# Patient Record
Sex: Male | Born: 1975
Health system: Southern US, Community
[De-identification: ages and names within clinical notes are randomized; demographics above are authoritative.]

## PROBLEM LIST (undated history)

## (undated) DIAGNOSIS — R079 Chest pain, unspecified: Secondary | ICD-10-CM

## (undated) HISTORY — PX: WISDOM TOOTH EXTRACTION: SHX21

## (undated) HISTORY — DX: Chest pain, unspecified: R07.9

## (undated) HISTORY — PX: FINGER AMPUTATION: SHX636

## (undated) HISTORY — PX: FOOT SURGERY: SHX648

---

## 1998-02-26 ENCOUNTER — Emergency Department (HOSPITAL_COMMUNITY): Admission: EM | Admit: 1998-02-26 | Discharge: 1998-02-26 | Payer: Self-pay | Admitting: Emergency Medicine

## 1998-04-10 ENCOUNTER — Emergency Department (HOSPITAL_COMMUNITY): Admission: EM | Admit: 1998-04-10 | Discharge: 1998-04-11 | Payer: Self-pay | Admitting: Emergency Medicine

## 1998-04-12 ENCOUNTER — Encounter: Admission: RE | Admit: 1998-04-12 | Discharge: 1998-07-11 | Payer: Self-pay | Admitting: *Deleted

## 1998-07-02 ENCOUNTER — Emergency Department (HOSPITAL_COMMUNITY): Admission: EM | Admit: 1998-07-02 | Discharge: 1998-07-02 | Payer: Self-pay | Admitting: Emergency Medicine

## 1998-07-19 ENCOUNTER — Encounter: Admission: RE | Admit: 1998-07-19 | Discharge: 1998-07-19 | Payer: Self-pay | Admitting: *Deleted

## 1998-07-25 ENCOUNTER — Ambulatory Visit: Admission: RE | Admit: 1998-07-25 | Discharge: 1998-07-25 | Payer: Self-pay | Admitting: *Deleted

## 1998-07-25 ENCOUNTER — Encounter: Payer: Self-pay | Admitting: *Deleted

## 1998-08-08 ENCOUNTER — Emergency Department (HOSPITAL_COMMUNITY): Admission: EM | Admit: 1998-08-08 | Discharge: 1998-08-08 | Payer: Self-pay | Admitting: Emergency Medicine

## 2008-05-25 ENCOUNTER — Emergency Department (HOSPITAL_COMMUNITY): Admission: EM | Admit: 2008-05-25 | Discharge: 2008-05-25 | Payer: Self-pay | Admitting: Emergency Medicine

## 2008-06-01 ENCOUNTER — Emergency Department (HOSPITAL_COMMUNITY): Admission: EM | Admit: 2008-06-01 | Discharge: 2008-06-01 | Payer: Self-pay | Admitting: Specialist

## 2009-04-09 ENCOUNTER — Emergency Department (HOSPITAL_COMMUNITY): Admission: EM | Admit: 2009-04-09 | Discharge: 2009-04-09 | Payer: Self-pay | Admitting: Emergency Medicine

## 2010-08-03 ENCOUNTER — Emergency Department (HOSPITAL_COMMUNITY)
Admission: EM | Admit: 2010-08-03 | Discharge: 2010-08-03 | Discharge status: Against Medical Advice | Payer: BC Managed Care – PPO | Attending: Emergency Medicine | Admitting: Emergency Medicine

## 2010-08-03 DIAGNOSIS — R42 Dizziness and giddiness: Secondary | ICD-10-CM | POA: Insufficient documentation

## 2010-08-04 ENCOUNTER — Emergency Department (HOSPITAL_COMMUNITY)
Admission: EM | Admit: 2010-08-04 | Discharge: 2010-08-04 | Disposition: A | Discharge status: Home / Self Care | Payer: BC Managed Care – PPO | Attending: Emergency Medicine | Admitting: Emergency Medicine

## 2010-08-04 ENCOUNTER — Emergency Department (HOSPITAL_COMMUNITY): Payer: BC Managed Care – PPO

## 2010-08-04 DIAGNOSIS — F17200 Nicotine dependence, unspecified, uncomplicated: Secondary | ICD-10-CM | POA: Insufficient documentation

## 2010-08-04 DIAGNOSIS — R5381 Other malaise: Secondary | ICD-10-CM | POA: Insufficient documentation

## 2010-08-04 LAB — CBC
HCT: 43 % (ref 39.0–52.0)
Hemoglobin: 14.8 g/dL (ref 13.0–17.0)
MCH: 31 pg (ref 26.0–34.0)
MCHC: 34.4 g/dL (ref 30.0–36.0)
MCV: 90.1 fL (ref 78.0–100.0)
Platelets: 254 10*3/uL (ref 150–400)
RBC: 4.77 MIL/uL (ref 4.22–5.81)
RDW: 13.1 % (ref 11.5–15.5)
WBC: 6.4 10*3/uL (ref 4.0–10.5)

## 2010-08-04 LAB — POCT I-STAT, CHEM 8
BUN: 13 mg/dL (ref 6–23)
Calcium, Ion: 1.26 mmol/L (ref 1.12–1.32)
Chloride: 105 mEq/L (ref 96–112)
Creatinine, Ser: 1.1 mg/dL (ref 0.4–1.5)
Glucose, Bld: 101 mg/dL — ABNORMAL HIGH (ref 70–99)
Potassium: 4.3 mEq/L (ref 3.5–5.1)

## 2010-08-04 LAB — DIFFERENTIAL
Basophils Absolute: 0 10*3/uL (ref 0.0–0.1)
Basophils Relative: 1 % (ref 0–1)
Eosinophils Absolute: 0.2 10*3/uL (ref 0.0–0.7)
Eosinophils Relative: 3 % (ref 0–5)
Lymphocytes Relative: 42 % (ref 12–46)
Lymphs Abs: 2.7 10*3/uL (ref 0.7–4.0)
Monocytes Absolute: 0.7 10*3/uL (ref 0.1–1.0)
Monocytes Relative: 11 % (ref 3–12)
Neutro Abs: 2.9 10*3/uL (ref 1.7–7.7)
Neutrophils Relative %: 45 % (ref 43–77)

## 2010-08-04 LAB — URINALYSIS, ROUTINE W REFLEX MICROSCOPIC
Bilirubin Urine: NEGATIVE
Hgb urine dipstick: NEGATIVE
Ketones, ur: NEGATIVE mg/dL
Nitrite: NEGATIVE
Urine Glucose, Fasting: NEGATIVE mg/dL
pH: 7 (ref 5.0–8.0)

## 2011-10-07 ENCOUNTER — Emergency Department (HOSPITAL_COMMUNITY)
Admission: EM | Admit: 2011-10-07 | Discharge: 2011-10-07 | Disposition: A | Discharge status: Home / Self Care | Payer: BC Managed Care – PPO | Attending: Emergency Medicine | Admitting: Emergency Medicine

## 2011-10-07 ENCOUNTER — Encounter (HOSPITAL_COMMUNITY): Payer: Self-pay

## 2011-10-07 DIAGNOSIS — J329 Chronic sinusitis, unspecified: Secondary | ICD-10-CM | POA: Insufficient documentation

## 2011-10-07 DIAGNOSIS — F1210 Cannabis abuse, uncomplicated: Secondary | ICD-10-CM | POA: Insufficient documentation

## 2011-10-07 MED ORDER — TRIAMCINOLONE ACETONIDE(NASAL) 55 MCG/ACT NA INHA: 2.0000 | Freq: Every day | NASAL | Status: DC

## 2011-10-07 MED ORDER — AZITHROMYCIN 250 MG PO TABS: 250.0000 mg | ORAL_TABLET | Freq: Every day | ORAL | Status: AC

## 2011-10-07 NOTE — ED Notes (Signed)
Patient here with 3 days of congestion and sinus pressure with frontal headache. Taking over the counter allergy meds without relief

## 2011-10-07 NOTE — ED Provider Notes (Signed)
History     CSN: [id number]  Arrival date & time 10/07/11  1251   First MD Initiated Contact with Patient 10/07/11 1524      Chief Complaint  Patient presents with  . Facial Pain    (Consider location/radiation/quality/duration/timing/severity/associated sxs/prior treatment) Patient is a 36 y.o. male presenting with headaches. The history is provided by the patient.  Headache  This is a new problem. The current episode started more than 2 days ago. The problem occurs constantly. The problem has been gradually worsening. The pain is located in the bilateral and frontal region. The pain is moderate. Associated symptoms include malaise/fatigue. Pertinent negatives include no fever and no nausea. He has tried acetaminophen (He has attempted Zyrtec and over-the-counter cold medications without relief.) for the symptoms.    History reviewed. No pertinent past medical history.  History reviewed. No pertinent past surgical history.  No family history on file.  History  Substance Use Topics  . Smoking status: Current Everyday Smoker -- 0.5 packs/day  . Smokeless tobacco: Not on file  . Alcohol Use:       Review of Systems  Constitutional: Positive for malaise/fatigue. Negative for fever.  HENT: Positive for congestion, sneezing and sinus pressure. Negative for facial swelling and dental problem.   Respiratory: Negative.  Negative for cough.   Gastrointestinal: Negative.  Negative for nausea.  Skin: Negative.  Negative for rash.  Neurological: Positive for headaches.    Allergies  Review of patient's allergies indicates no known allergies.  Home Medications   Current Outpatient Rx  Name Route Sig Dispense Refill  . ASPIRIN 325 MG PO TABS Oral Take 325 mg by mouth once. Headache.    . CETIRIZINE HCL 10 MG PO TABS Oral Take 10 mg by mouth daily.      BP 141/70  Pulse 82  Temp 98.7 F (37.1 C)  Resp 18  SpO2 100%  Physical Exam  Constitutional: He appears  well-developed and well-nourished.  HENT:  Head: Normocephalic.  Right Ear: External ear normal.  Left Ear: External ear normal.  Nose: Mucosal edema present. Right sinus exhibits frontal sinus tenderness. Left sinus exhibits frontal sinus tenderness.  Mouth/Throat: Oropharynx is clear and moist.  Eyes: Conjunctivae are normal.  Neck: Normal range of motion. Neck supple.  Cardiovascular: Normal rate and normal heart sounds.   No murmur heard. Pulmonary/Chest: Effort normal and breath sounds normal. He has no wheezes. He has no rales.  Musculoskeletal: Normal range of motion.  Lymphadenopathy:    He has no cervical adenopathy.  Skin: Skin is warm and dry. No pallor.    ED Course  Procedures (including critical care time)  Labs Reviewed - No data to display No results found.   No diagnosis found. 1. sinusitis   MDM  Patient presents with symptoms of sinus pressure and congestion wihtout relief with OTC/allergy measures. Symptoms worsening. No history of seasonal allergies, now with fatigue and myalgias. Will tx with course abx and symptomatic treatment.        Rodena Medin, PA-C 10/07/11 1539

## 2011-10-07 NOTE — Discharge Instructions (Signed)
RECOMMEND USE OF PLAIN SALINE NASAL SPRAY AS WELL AS CONTINUED USE OF ZYRTEC. TAKE PRESCRIPTION MEDICATIONS AS PRESCRIBED. FOLLOW UP WITH A PRIMARY CARE PROVIDER OF YOUR CHOICE FOR FURTHER EVALUATION IF SYMPTOMS PERSIST.  Sinusitis Sinuses are air pockets within the bones of your face. The growth of bacteria within a sinus leads to infection. The infection prevents the sinuses from draining. This infection is called sinusitis. SYMPTOMS  There will be different areas of pain depending on which sinuses have become infected.  The maxillary sinuses often produce pain beneath the eyes.   Frontal sinusitis may cause pain in the middle of the forehead and above the eyes.  Other problems (symptoms) include:  Toothaches.   Colored, pus-like (purulent) drainage from the nose.   Swelling, warmth, and tenderness over the sinus areas may be signs of infection.  TREATMENT  Sinusitis is most often determined by an exam.X-rays may be taken. If x-rays have been taken, make sure you obtain your results or find out how you are to obtain them. Your caregiver may give you medications (antibiotics). These are medications that will help kill the bacteria causing the infection. You may also be given a medication (decongestant) that helps to reduce sinus swelling.  HOME CARE INSTRUCTIONS   Only take over-the-counter or prescription medicines for pain, discomfort, or fever as directed by your caregiver.   Drink extra fluids. Fluids help thin the mucus so your sinuses can drain more easily.   Applying either moist heat or ice packs to the sinus areas may help relieve discomfort.   Use saline nasal sprays to help moisten your sinuses. The sprays can be found at your local drugstore.  SEEK IMMEDIATE MEDICAL CARE IF:  You have a fever.   You have increasing pain, severe headaches, or toothache.   You have nausea, vomiting, or drowsiness.   You develop unusual swelling around the face or trouble seeing.  MAKE  SURE YOU:   Understand these instructions.   Will watch your condition.   Will get help right away if you are not doing well or get worse.  Document Released: 06/03/2005 Document Revised: 05/23/2011 Document Reviewed: 12/31/2006 [facility] Patient Information 2012 [place], [place].

## 2011-10-09 NOTE — ED Provider Notes (Signed)
History/physical exam/procedure(s) were performed by non-physician practitioner and as supervising physician I was immediately available for consultation/collaboration. I have reviewed all notes and am in agreement with care and plan.   Hilario Quarry, MD 10/09/11 1515

## 2013-03-28 ENCOUNTER — Emergency Department (HOSPITAL_COMMUNITY)
Admission: EM | Admit: 2013-03-28 | Discharge: 2013-03-28 | Disposition: A | Discharge status: Home / Self Care | Payer: BC Managed Care – PPO | Attending: Emergency Medicine | Admitting: Emergency Medicine

## 2013-03-28 ENCOUNTER — Encounter (HOSPITAL_COMMUNITY): Payer: Self-pay | Admitting: Emergency Medicine

## 2013-03-28 DIAGNOSIS — F17200 Nicotine dependence, unspecified, uncomplicated: Secondary | ICD-10-CM | POA: Insufficient documentation

## 2013-03-28 DIAGNOSIS — IMO0002 Reserved for concepts with insufficient information to code with codable children: Secondary | ICD-10-CM | POA: Insufficient documentation

## 2013-03-28 DIAGNOSIS — Z79899 Other long term (current) drug therapy: Secondary | ICD-10-CM | POA: Insufficient documentation

## 2013-03-28 DIAGNOSIS — Z792 Long term (current) use of antibiotics: Secondary | ICD-10-CM | POA: Insufficient documentation

## 2013-03-28 DIAGNOSIS — L250 Unspecified contact dermatitis due to cosmetics: Secondary | ICD-10-CM | POA: Insufficient documentation

## 2013-03-28 MED ORDER — DOXYCYCLINE HYCLATE 100 MG PO CAPS: 100.0000 mg | ORAL_CAPSULE | Freq: Two times a day (BID) | ORAL | Status: DC

## 2013-03-28 MED ORDER — PREDNISONE 20 MG PO TABS
60.0000 mg | ORAL_TABLET | Freq: Once | ORAL | Status: AC
  Administered 2013-03-28: 60 mg via ORAL
  Filled 2013-03-28: qty 3

## 2013-03-28 MED ORDER — DIPHENHYDRAMINE HCL 25 MG PO CAPS
25.0000 mg | ORAL_CAPSULE | Freq: Once | ORAL | Status: AC
  Administered 2013-03-28: 25 mg via ORAL
  Filled 2013-03-28: qty 1

## 2013-03-28 MED ORDER — PREDNISONE 10 MG PO TABS: 10.0000 mg | ORAL_TABLET | Freq: Every day | ORAL | Status: DC

## 2013-03-28 NOTE — ED Provider Notes (Signed)
Medical screening examination/treatment/procedure(s) were performed by non-physician practitioner and as supervising physician I was immediately available for consultation/collaboration.  Megan E Docherty, MD 03/28/13 2232 

## 2013-03-28 NOTE — ED Provider Notes (Signed)
CSN: [id number]     Arrival date & time 03/28/13  1438 History  This chart was scribed for non-physician practitioner, Marlon Pel, working with Toy Baker, MD by Clydene Laming, ED Scribe. This patient was seen in room WTR7/WTR7 and the patient's care was started at 3:21 PM.   Chief Complaint  Patient presents with  . allergic reaction to beard dye    The history is provided by the patient. No language interpreter was used.   HPI Comments: Jared Reynolds is a 37 y.o. male who presents to the Emergency Department complaining of an allergic reaction on both sides of the face from [employer] beard dye that he used yesterday. Pt states he felt tingling after applying beard dye and it began to worsen. Pt states he has used the same brand of beard dye previously and did not experience this.They looked on the Internet and believe the ingredients have been changed because many people have been having these issues. He denies problems SOB,wheezing, and fever, lip, tongue or throat swelling.  History reviewed. No pertinent past medical history. Past Surgical History  Procedure Laterality Date  . Finger amputation      left hand index finger 2000   History reviewed. No pertinent family history. History  Substance Use Topics  . Smoking status: Light Tobacco Smoker -- 0.50 packs/day    Types: Cigars  . Smokeless tobacco: Not on file  . Alcohol Use: Yes     Comment: 1 beer/ day    Review of Systems  Constitutional: Negative for fever.  Respiratory: Negative for shortness of breath.   Skin: Positive for rash.    Allergies  Review of patient's allergies indicates no known allergies.  Home Medications   Current Outpatient Rx  Name  Route  Sig  Dispense  Refill  . Aspirin-Caffeine (BAYER BACK & BODY PAIN EX ST) 500-32.5 MG TABS   Oral   Take 1 tablet by mouth every 8 (eight) hours as needed (back pain).         Marland Kitchen doxycycline (VIBRAMYCIN) 100 MG capsule   Oral   Take 1 capsule  (100 mg total) by mouth 2 (two) times daily.   20 capsule   0   . predniSONE (DELTASONE) 10 MG tablet   Oral   Take 1 tablet (10 mg total) by mouth daily.   21 tablet   0     Prednisone dose pack directions:   6 tabs on day ...   . EXPIRED: triamcinolone (NASACORT) 55 MCG/ACT nasal inhaler   Nasal   Place 2 sprays into the nose daily.   1 Inhaler   12    Triage Vitals:BP 158/99  Pulse 89  Temp(Src) 98.1 F (36.7 C) (Oral)  Resp 16  SpO2 100% Physical Exam  Nursing note and vitals reviewed. Constitutional: He is oriented to person, place, and time. He appears well-developed and well-nourished. No distress.  HENT:  Head: Normocephalic and atraumatic.    Eyes: EOM are normal.  Neck: Neck supple. No tracheal deviation present.  Cardiovascular: Normal rate.   Pulmonary/Chest: Effort normal. No respiratory distress.  Musculoskeletal: Normal range of motion.  Neurological: He is alert and oriented to person, place, and time.  Skin: Skin is warm and dry.  Psychiatric: He has a normal mood and affect. His behavior is normal.    ED Course  Procedures (including critical care time) DIAGNOSTIC STUDIES: Oxygen Saturation is 100% on RA, normal by my interpretation.    COORDINATION  OF CARE: 3:29 PM- Discussed treatment plan with pt at bedside. Pt prescribed Doxycyline and prednisone taper.Pt verbalized understanding and agreement with plan.   Labs Review Labs Reviewed - No data to display Imaging Review No results found.  EKG Interpretation   None       MDM   1. Contact dermatitis due to cosmetics      Pt advised not to use product anymore. No systemic or respiratory involvement. Will treat with prednisone taper. Doxycycline given because of the drainage coming from wound and the induration/firmness to prevent infection.  37 y.o.Jared Reynolds's evaluation in the Emergency Department is complete. It has been determined that no acute conditions requiring  further emergency intervention are present at this time. The patient/guardian have been advised of the diagnosis and plan. We have discussed signs and symptoms that warrant return to the ED, such as changes or worsening in symptoms.  Vital signs are stable at discharge. Filed Vitals:   03/28/13 1446  BP: 158/99  Pulse: 89  Temp: 98.1 F (36.7 C)  Resp: 16    Patient/guardian has voiced understanding and agreed to follow-up with the PCP or specialist.  I personally performed the services described in this documentation, which was scribed in my presence. The recorded information has been reviewed and is accurate.   Dorthula Matas, PA-C 03/28/13 1541

## 2013-03-28 NOTE — ED Notes (Signed)
Pt reports he applied beard dye yesterday. Reports tingling/ pain, started "pussing up" and then swelling. Pt thinks eyes are starting to swell. Pt denies SOB/difficulty breathing or swallowing.

## 2014-07-20 ENCOUNTER — Encounter (HOSPITAL_COMMUNITY): Payer: Self-pay

## 2014-07-20 ENCOUNTER — Emergency Department (HOSPITAL_COMMUNITY)
Admission: EM | Admit: 2014-07-20 | Discharge: 2014-07-20 | Disposition: A | Discharge status: Home / Self Care | Payer: BLUE CROSS/BLUE SHIELD | Attending: Emergency Medicine | Admitting: Emergency Medicine

## 2014-07-20 DIAGNOSIS — Z7952 Long term (current) use of systemic steroids: Secondary | ICD-10-CM | POA: Insufficient documentation

## 2014-07-20 DIAGNOSIS — X58XXXA Exposure to other specified factors, initial encounter: Secondary | ICD-10-CM | POA: Diagnosis not present

## 2014-07-20 DIAGNOSIS — Y9389 Activity, other specified: Secondary | ICD-10-CM | POA: Diagnosis not present

## 2014-07-20 DIAGNOSIS — Z7982 Long term (current) use of aspirin: Secondary | ICD-10-CM | POA: Diagnosis not present

## 2014-07-20 DIAGNOSIS — Y9289 Other specified places as the place of occurrence of the external cause: Secondary | ICD-10-CM | POA: Diagnosis not present

## 2014-07-20 DIAGNOSIS — Z720 Tobacco use: Secondary | ICD-10-CM | POA: Insufficient documentation

## 2014-07-20 DIAGNOSIS — S39012A Strain of muscle, fascia and tendon of lower back, initial encounter: Secondary | ICD-10-CM | POA: Insufficient documentation

## 2014-07-20 DIAGNOSIS — Y998 Other external cause status: Secondary | ICD-10-CM | POA: Insufficient documentation

## 2014-07-20 DIAGNOSIS — Z793 Long term (current) use of hormonal contraceptives: Secondary | ICD-10-CM | POA: Insufficient documentation

## 2014-07-20 DIAGNOSIS — S3992XA Unspecified injury of lower back, initial encounter: Secondary | ICD-10-CM | POA: Diagnosis present

## 2014-07-20 MED ORDER — NAPROXEN 500 MG PO TABS: 500.0000 mg | ORAL_TABLET | Freq: Two times a day (BID) | ORAL | Status: DC

## 2014-07-20 MED ORDER — NAPROXEN 500 MG PO TABS
500.0000 mg | ORAL_TABLET | Freq: Once | ORAL | Status: AC
  Administered 2014-07-20: 500 mg via ORAL
  Filled 2014-07-20: qty 1

## 2014-07-20 MED ORDER — METHOCARBAMOL 500 MG PO TABS
500.0000 mg | ORAL_TABLET | Freq: Once | ORAL | Status: AC
  Administered 2014-07-20: 500 mg via ORAL
  Filled 2014-07-20: qty 1

## 2014-07-20 MED ORDER — METHOCARBAMOL 500 MG PO TABS: 500.0000 mg | ORAL_TABLET | Freq: Two times a day (BID) | ORAL | Status: DC

## 2014-07-20 NOTE — ED Notes (Signed)
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  Monday evening lower back started hurting.  Tuesday pain was worse.  Today, pt is having trouble bending, turning, putting on clothes.

## 2014-07-20 NOTE — ED Provider Notes (Signed)
CSN: [id number]     Arrival date & time 07/20/14  1317 History  This chart was scribed for non-physician practitioner, Sharilyn Sites, PA-C, working with Linwood Dibbles, MD by Charline Bills, ED Scribe. This patient was seen in room WTR7/WTR7 and the patient's care was started at 1:39 PM.   Chief Complaint  Patient presents with  . Back Pain   The history is provided by the patient. No language interpreter was used.   HPI Comments: Jared Reynolds is a 39 y.o. male who presents to the Emergency Department complaining of gradually worsening lower pack pain for the past 2 days. 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 Pt was not wearing a back brace. Pt describes pain as tightness that is exacerbated with movement. Pt reports back injury approximately 15 years ago. He denies leg pain, urinary symptoms, difficulty self-ambulating, numbness, weakness, or paresthesias of extremities. Pt has been treating with Back Aid Max medication with minimal relief. No known allergies.   History reviewed. No pertinent past medical history. Past Surgical History  Procedure Laterality Date  . Finger amputation      left hand index finger 2000   History reviewed. No pertinent family history. History  Substance Use Topics  . Smoking status: Light Tobacco Smoker -- 0.50 packs/day    Types: Cigars  . Smokeless tobacco: Not on file  . Alcohol Use: Yes     Comment: 1 beer/ day    Review of Systems  Genitourinary: Negative.   Musculoskeletal: Positive for back pain.  All other systems reviewed and are negative.  Allergies  Review of patient's allergies indicates no known allergies.  Home Medications   Prior to Admission medications   Medication Sig Start Date End Date Taking? Authorizing Provider  Aspirin-Caffeine (BAYER BACK & BODY PAIN EX ST) 500-32.5 MG TABS Take 1 tablet by mouth every 8 (eight) hours as needed (back pain).     Historical Provider, MD  doxycycline (VIBRAMYCIN) 100 MG capsule Take 1 capsule (100 mg total) by mouth 2 (two) times daily. 03/28/13   Tiffany Irine Seal, PA-C  predniSONE (DELTASONE) 10 MG tablet Take 1 tablet (10 mg total) by mouth daily. 03/28/13   Tiffany Irine Seal, PA-C  triamcinolone (NASACORT) 55 MCG/ACT nasal inhaler Place 2 sprays into the nose daily. 10/07/11 10/06/12  Arnoldo Hooker, PA-C   Triage Vitals: BP 127/77 mmHg  Pulse 92  Temp(Src) 98.8 F (37.1 C) (Oral)  Resp 18  SpO2 100% Physical Exam  Constitutional: He is oriented to person, place, and time. He appears well-developed and well-nourished.  HENT:  Head: Normocephalic and atraumatic.  Mouth/Throat: Oropharynx is clear and moist.  Eyes: Conjunctivae and EOM are normal. Pupils are equal, round, and reactive to light.  Neck: Normal range of motion.  Cardiovascular: Normal rate, regular rhythm and normal heart sounds.   Pulmonary/Chest: Effort normal and breath sounds normal.  Abdominal: Soft. Bowel sounds are normal.  Musculoskeletal: Normal range of motion.       Lumbar back: Normal.  LS normal in appearance; no bony deformities or focal tenderness; pain exacerbated with twisting to the left and right, described as a "tightness"; normal strength and sensation of BLE; normal gait  Neurological: He is alert and oriented to person, place, and time.  Skin: Skin is warm and dry.  Psychiatric: He has a normal mood and affect.  Nursing note and vitals reviewed.  ED Course  Procedures (including critical care time) DIAGNOSTIC STUDIES: Oxygen Saturation is 100% on RA, normal by my interpretation.    COORDINATION OF CARE: 1:41 PM-Discussed treatment plan which includes Robaxin and Naproxen with pt at bedside and pt agreed to plan.   Labs Review Labs Reviewed - No data to display  Imaging Review No results found.   EKG Interpretation None      MDM   Final diagnoses:  Lumbar strain, initial encounter    39 year old male with low back pain and tightness after moving washing machines and dryers on Monday. No focal tenderness or deformities noted on exam. No red flag symptoms or focal neurologic deficits noted.  Suspect lumbar strain.  Patient will be started on robaxin and naprosyn, recommended heat as well.  Discussed plan with patient, he/she acknowledged understanding and agreed with plan of care.  Return precautions given for new or worsening symptoms.  I personally performed the services described in this documentation, which was scribed in my presence. The recorded information has been reviewed and is accurate.  Garlon HatchetLisa M Tylah Mancillas, PA-C 07/20/14 1350  Linwood DibblesJon Knapp, MD 07/20/14 1640

## 2014-07-20 NOTE — Discharge Instructions (Signed)
Take the prescribed medication as directed. May wish to apply heat to affected areas. Return to the ED for new or worsening symptoms.

## 2015-08-05 ENCOUNTER — Emergency Department (HOSPITAL_COMMUNITY)
Admission: EM | Admit: 2015-08-05 | Discharge: 2015-08-05 | Disposition: A | Discharge status: Home / Self Care | Payer: BLUE CROSS/BLUE SHIELD | Attending: Emergency Medicine | Admitting: Emergency Medicine

## 2015-08-05 ENCOUNTER — Encounter (HOSPITAL_COMMUNITY): Payer: Self-pay | Admitting: Emergency Medicine

## 2015-08-05 DIAGNOSIS — Z791 Long term (current) use of non-steroidal anti-inflammatories (NSAID): Secondary | ICD-10-CM | POA: Diagnosis not present

## 2015-08-05 DIAGNOSIS — X58XXXD Exposure to other specified factors, subsequent encounter: Secondary | ICD-10-CM | POA: Insufficient documentation

## 2015-08-05 DIAGNOSIS — Z792 Long term (current) use of antibiotics: Secondary | ICD-10-CM | POA: Diagnosis not present

## 2015-08-05 DIAGNOSIS — K2970 Gastritis, unspecified, without bleeding: Secondary | ICD-10-CM | POA: Insufficient documentation

## 2015-08-05 DIAGNOSIS — K2960 Other gastritis without bleeding: Secondary | ICD-10-CM

## 2015-08-05 DIAGNOSIS — F17210 Nicotine dependence, cigarettes, uncomplicated: Secondary | ICD-10-CM | POA: Diagnosis not present

## 2015-08-05 DIAGNOSIS — T39395A Adverse effect of other nonsteroidal anti-inflammatory drugs [NSAID], initial encounter: Secondary | ICD-10-CM | POA: Insufficient documentation

## 2015-08-05 DIAGNOSIS — R1013 Epigastric pain: Secondary | ICD-10-CM | POA: Diagnosis present

## 2015-08-05 DIAGNOSIS — Z79899 Other long term (current) drug therapy: Secondary | ICD-10-CM | POA: Insufficient documentation

## 2015-08-05 DIAGNOSIS — S39012D Strain of muscle, fascia and tendon of lower back, subsequent encounter: Secondary | ICD-10-CM

## 2015-08-05 DIAGNOSIS — Z7952 Long term (current) use of systemic steroids: Secondary | ICD-10-CM | POA: Diagnosis not present

## 2015-08-05 LAB — COMPREHENSIVE METABOLIC PANEL
ALT: 55 U/L (ref 17–63)
ANION GAP: 8 (ref 5–15)
AST: 27 U/L (ref 15–41)
Albumin: 4.7 g/dL (ref 3.5–5.0)
Alkaline Phosphatase: 58 U/L (ref 38–126)
BUN: 12 mg/dL (ref 6–20)
CHLORIDE: 106 mmol/L (ref 101–111)
CO2: 25 mmol/L (ref 22–32)
Calcium: 9.3 mg/dL (ref 8.9–10.3)
Creatinine, Ser: 0.85 mg/dL (ref 0.61–1.24)
Glucose, Bld: 77 mg/dL (ref 65–99)
POTASSIUM: 3.7 mmol/L (ref 3.5–5.1)
SODIUM: 139 mmol/L (ref 135–145)
Total Bilirubin: 0.9 mg/dL (ref 0.3–1.2)
Total Protein: 7.3 g/dL (ref 6.5–8.1)

## 2015-08-05 LAB — CBC WITH DIFFERENTIAL/PLATELET
BASOS ABS: 0 10*3/uL (ref 0.0–0.1)
Basophils Relative: 1 %
Eosinophils Absolute: 0.2 10*3/uL (ref 0.0–0.7)
Eosinophils Relative: 3 %
HCT: 41.7 % (ref 39.0–52.0)
HEMOGLOBIN: 14.1 g/dL (ref 13.0–17.0)
LYMPHS ABS: 2.6 10*3/uL (ref 0.7–4.0)
LYMPHS PCT: 36 %
MCH: 30.5 pg (ref 26.0–34.0)
MCHC: 33.8 g/dL (ref 30.0–36.0)
MCV: 90.3 fL (ref 78.0–100.0)
Monocytes Absolute: 0.7 10*3/uL (ref 0.1–1.0)
Monocytes Relative: 9 %
NEUTROS ABS: 3.6 10*3/uL (ref 1.7–7.7)
NEUTROS PCT: 51 %
PLATELETS: 248 10*3/uL (ref 150–400)
RBC: 4.62 MIL/uL (ref 4.22–5.81)
RDW: 13.8 % (ref 11.5–15.5)
WBC: 7.1 10*3/uL (ref 4.0–10.5)

## 2015-08-05 LAB — LIPASE, BLOOD: LIPASE: 22 U/L (ref 11–51)

## 2015-08-05 MED ORDER — TRAMADOL HCL 50 MG PO TABS: 100.0000 mg | ORAL_TABLET | Freq: Four times a day (QID) | ORAL | Status: DC | PRN

## 2015-08-05 MED ORDER — PANTOPRAZOLE SODIUM 40 MG PO TBEC
40.0000 mg | DELAYED_RELEASE_TABLET | Freq: Once | ORAL | Status: AC
  Administered 2015-08-05: 40 mg via ORAL
  Filled 2015-08-05: qty 1

## 2015-08-05 MED ORDER — ORPHENADRINE CITRATE ER 100 MG PO TB12: 100.0000 mg | ORAL_TABLET | Freq: Two times a day (BID) | ORAL | Status: DC

## 2015-08-05 MED ORDER — PANTOPRAZOLE SODIUM 20 MG PO TBEC: 20.0000 mg | DELAYED_RELEASE_TABLET | Freq: Every day | ORAL | Status: DC

## 2015-08-05 NOTE — ED Notes (Signed)
Awake. Verbally responsive. A/O x4. Resp even and unlabored. No audible adventitious breath sounds noted. ABC's intact.  

## 2015-08-05 NOTE — ED Notes (Addendum)
Pt reported sharp epigastic pain s/p to taking pain medication for chronic back pain without n/v/d. LBM today. Pt denies urinary problems.

## 2015-08-05 NOTE — ED Notes (Signed)
Pt reports intermittent sharp upper abdominal pain onset this morning. Pt denies n/v/d; last BM this morning; denies GU symptoms. Pt continues to report acute chronic lower back pain.

## 2015-08-05 NOTE — ED Provider Notes (Signed)
CSN: [id number]     Arrival date & time 08/05/15  4098 History   First MD Initiated Contact with Patient 08/05/15 0831     Chief Complaint  Patient presents with  . Abdominal Pain     (Consider location/radiation/quality/duration/timing/severity/associated sxs/prior Treatment) HPI He reports that he threw his back out last week. He has had problems with intermittent back pain since a distant injury. He reports approximately 1 time per year it seems to "go out". Pain is central and lower. He reports it is exacerbated by cough and twisting motions. He has no lower extremity weakness or numbness. No bowel or bladder dysfunction. Due to this, the patient has been taking large doses of ibuprofen. He describes taking 2 tablets every 6 hours. It appears he may be taking a 600 mg tablet which would equate to 1200 mg every 6 hours. He reports this morning he developed sharp abdominal pains. They have come and gone in waves. It's in his epigastrium. No vomiting. No diarrhea. No urinary symptoms. He denies prior history of having problems with abdominal pain, peptic ulcer or gastritis. History reviewed. No pertinent past medical history. Past Surgical History  Procedure Laterality Date  . Finger amputation      left hand index finger 2000   No family history on file. Social History  Substance Use Topics  . Smoking status: Light Tobacco Smoker -- 0.50 packs/day    Types: Cigars  . Smokeless tobacco: None  . Alcohol Use: Yes     Comment: 1 beer/ day    Review of Systems 10 Systems reviewed and are negative for acute change except as noted in the HPI.    Allergies  Review of patient's allergies indicates no known allergies.  Home Medications   Prior to Admission medications   Medication Sig Start Date End Date Taking? Authorizing Provider  Aspirin-Caffeine (BAYER BACK & BODY PAIN EX ST) 500-32.5 MG TABS Take 1 tablet by mouth every 8 (eight) hours as needed (back pain).    Historical  Provider, MD  doxycycline (VIBRAMYCIN) 100 MG capsule Take 1 capsule (100 mg total) by mouth 2 (two) times daily. 03/28/13   Tiffany Neva Seat, PA-C  methocarbamol (ROBAXIN) 500 MG tablet Take 1 tablet (500 mg total) by mouth 2 (two) times daily. 07/20/14   Garlon Hatchet, PA-C  naproxen (NAPROSYN) 500 MG tablet Take 1 tablet (500 mg total) by mouth 2 (two) times daily with a meal. 07/20/14   Garlon Hatchet, PA-C  orphenadrine (NORFLEX) 100 MG tablet Take 1 tablet (100 mg total) by mouth 2 (two) times daily. 08/05/15   Arby Barrette, MD  pantoprazole (PROTONIX) 20 MG tablet Take 1 tablet (20 mg total) by mouth daily. 08/05/15   Arby Barrette, MD  predniSONE (DELTASONE) 10 MG tablet Take 1 tablet (10 mg total) by mouth daily. 03/28/13   Tiffany Neva Seat, PA-C  traMADol (ULTRAM) 50 MG tablet Take 2 tablets (100 mg total) by mouth every 6 (six) hours as needed. 08/05/15   Arby Barrette, MD  triamcinolone (NASACORT) 55 MCG/ACT nasal inhaler Place 2 sprays into the nose daily. 10/07/11 10/06/12  Shari Upstill, PA-C   BP 147/93 mmHg  Pulse 82  Temp(Src) 98.4 F (36.9 C) (Oral)  Resp 18  SpO2 100% Physical Exam  Constitutional: He is oriented to person, place, and time. He appears well-developed and well-nourished.  HENT:  Head: Normocephalic and atraumatic.  Eyes: EOM are normal. Pupils are equal, round, and reactive to light.  Neck: Neck supple.  Cardiovascular: Normal rate, regular rhythm, normal heart sounds and intact distal pulses.   Pulmonary/Chest: Effort normal and breath sounds normal.  Abdominal: Soft. Bowel sounds are normal. He exhibits no distension. There is no tenderness.  Musculoskeletal: Normal range of motion. He exhibits no edema or tenderness.  No reproducible back pain to palpation. He indicates the area of pain is at approximately the L5-S1 region. Pain is exacerbated by forward flexion.  Neurological: He is alert and oriented to person, place, and time. He has normal strength. No  cranial nerve deficit. He exhibits normal muscle tone. Coordination normal. GCS eye subscore is 4. GCS verbal subscore is 5. GCS motor subscore is 6.  Skin: Skin is warm, dry and intact.  Psychiatric: He has a normal mood and affect.    ED Course  Procedures (including critical care time) Labs Review Labs Reviewed  COMPREHENSIVE METABOLIC PANEL  LIPASE, BLOOD  CBC WITH DIFFERENTIAL/PLATELET    Imaging Review No results found. I have personally reviewed and evaluated these images and lab results as part of my medical decision-making.   EKG Interpretation None      MDM   Final diagnoses:  Gastritis due to nonsteroidal anti-inflammatory drug  Low back strain, subsequent encounter   At this time, patient is well with a nonsurgical abdomen. Pain is most likely secondary to excessive NSAID use for lower back pain. Patient is not had any vomiting and no hematemesis. Vital signs and labs are stable. At this time he is given tramadol and Norflex take for low back pain and Protonix for gastritis. Structures have been given to avoid NSAIDs at this time.    Arby Barrette, MD 08/05/15 1053

## 2015-08-05 NOTE — Discharge Instructions (Signed)
Gastritis, Adult At this time, you will have to discontinue Motrin and any other medications known as nonsteroidal anti-inflammatories (NSAIDs). These medications can cause gastritis or ulcer if used in excess. At this time, take tramadol and Norflex as prescribed for your low back pain. Start daily Protonix as prescribed. Gastritis is soreness and swelling (inflammation) of the lining of the stomach. Gastritis can develop as a sudden onset (acute) or long-term (chronic) condition. If gastritis is not treated, it can lead to stomach bleeding and ulcers. CAUSES  Gastritis occurs when the stomach lining is weak or damaged. Digestive juices from the stomach then inflame the weakened stomach lining. The stomach lining may be weak or damaged due to viral or bacterial infections. One common bacterial infection is the Helicobacter pylori infection. Gastritis can also result from excessive alcohol consumption, taking certain medicines, or having too much acid in the stomach.  SYMPTOMS  In some cases, there are no symptoms. When symptoms are present, they may include:  Pain or a burning sensation in the upper abdomen.  Nausea.  Vomiting.  An uncomfortable feeling of fullness after eating. DIAGNOSIS  Your caregiver may suspect you have gastritis based on your symptoms and a physical exam. To determine the cause of your gastritis, your caregiver may perform the following:  Blood or stool tests to check for the H pylori bacterium.  Gastroscopy. A thin, flexible tube (endoscope) is passed down the esophagus and into the stomach. The endoscope has a light and camera on the end. Your caregiver uses the endoscope to view the inside of the stomach.  Taking a tissue sample (biopsy) from the stomach to examine under a microscope. TREATMENT  Depending on the cause of your gastritis, medicines may be prescribed. If you have a bacterial infection, such as an H pylori infection, antibiotics may be given. If your  gastritis is caused by too much acid in the stomach, H2 blockers or antacids may be given. Your caregiver may recommend that you stop taking aspirin, ibuprofen, or other nonsteroidal anti-inflammatory drugs (NSAIDs). HOME CARE INSTRUCTIONS  Only take over-the-counter or prescription medicines as directed by your caregiver.  If you were given antibiotic medicines, take them as directed. Finish them even if you start to feel better.  Drink enough fluids to keep your urine clear or pale yellow.  Avoid foods and drinks that make your symptoms worse, such as:  Caffeine or alcoholic drinks.  Chocolate.  Peppermint or mint flavorings.  Garlic and onions.  Spicy foods.  Citrus fruits, such as oranges, lemons, or limes.  Tomato-based foods such as sauce, chili, salsa, and pizza.  Fried and fatty foods.  Eat small, frequent meals instead of large meals. SEEK IMMEDIATE MEDICAL CARE IF:   You have black or dark red stools.  You vomit blood or material that looks like coffee grounds.  You are unable to keep fluids down.  Your abdominal pain gets worse.  You have a fever.  You do not feel better after 1 week.  You have any other questions or concerns. MAKE SURE YOU:  Understand these instructions.  Will watch your condition.  Will get help right away if you are not doing well or get worse.   This information is not intended to replace advice given to you by your health care provider. Make sure you discuss any questions you have with your health care provider.   Document Released: 05/28/2001 Document Revised: 12/03/2011 Document Reviewed: 07/17/2011 Elsevier Interactive Patient Education [employer]  Emergency  Department Resource Guide 1) Find a Doctor and Pay Out of Pocket Although you won't have to find out who is covered by your insurance plan, it is a good idea to ask around and get recommendations. You will then need to call the office and see if the doctor you  have chosen will accept you as a new patient and what types of options they offer for patients who are self-pay. Some doctors offer discounts or will set up payment plans for their patients who do not have insurance, but you will need to ask so you aren't surprised when you get to your appointment.  2) Contact Your Local Health Department Not all health departments have doctors that can see patients for sick visits, but many do, so it is worth a call to see if yours does. If you don't know where your local health department is, you can check in your phone book. The CDC also has a tool to help you locate your state's health department, and many state websites also have listings of all of their local health departments.  3) Find a Walk-in Clinic If your illness is not likely to be very severe or complicated, you may want to try a walk in clinic. These are popping up all over the country in pharmacies, drugstores, and shopping centers. They're usually staffed by nurse practitioners or physician assistants that have been trained to treat common illnesses and complaints. They're usually fairly quick and inexpensive. However, if you have serious medical issues or chronic medical problems, these are probably not your best option.  No Primary Care Doctor: - Call Health Connect at  [phone] - they can help you locate a primary care doctor that  accepts your insurance, provides certain services, etc. - Physician Referral Service- [phone]  Chronic Pain Problems: Organization         Address  Phone   Notes  [facility]  [phone] Patients need to be referred by their primary care doctor.   Medication Assistance: Organization         Address  Phone   Notes  [facility] Medication [facility] [address] [place] [place], [place] [address] [phone] --Must be a resident of [facility] -- Must have NO insurance coverage whatsoever (no Medicaid/ Medicare,  etc.) -- The pt. MUST have a primary care doctor that directs their care regularly and follows them in the community   [facility]  [phone]   [employer]  [phone]    Agencies that provide inexpensive medical care: Organization         Address  Phone   Notes  [facility]  [phone]   [facility]    [phone]   [facility] [address] [place], [place] [address] [phone]   [facility] [address] [place] [place] [phone]   Planned Parenthood    [phone]   [facility]    [phone]   [facility]  [address], [place] Phone:  [phone], Fax:  [phone] Hours of Operation:  9 am - 6 pm, M-F.  Also accepts Medicaid/Medicare and self-pay.  [facility]  [address],  Phone: [phone], Fax: [phone]. Hours of Operation:  8:30 am - 5:30 pm, M-F.  Also accepts Medicaid and self-pay.  [facility] [place] [address], [place] Phone: [phone]   [facility] [address] Natasha Bence [place], [place] [phone], Ext. [phone] Mondays & Thursdays: 7-9 AM.  First 15 patients are seen on a first come, first serve basis.    Medicaid-accepting [facility] Providers:  Organization         Address  Phone   Notes  [facility] [address], [place] [phone] Also accepts self-pay patients.  [facility] [address] Laurell Josephs [place], [place]  [phone]   [facility] [address], [place] [phone]   [facility] [address], [place] [phone]   Renaye Rakers [address], [place]   [phone] Only accepts [place] Access [place] patients after they have their name applied to their card.   Self-Pay (no  insurance) in [facility]:  Organization         Address  Phone   Notes  Sickle Cell Patients, [facility] [address] [place], [place] [phone]   [facility] [address] [place], [place] [phone]   [facility] [place]  [address], [place] [phone]   [facility]/Dr. Osei-Bonsu  [address] [place] or [address], [place] [phone] Phone number for both [place] and [place] locations is the same.  [facility] [address], [place] [phone]   [facility] [address] [place] or [address] [phone]   [facility] [address] [place] [phone], phone; [phone], fax Sees patients 1st and 3rd Saturday of every month.  Must not qualify for public or private insurance (i.e. Medicaid, Medicare, [place] Health Choice, Veterans' Benefits)  Household income should be no more than 200% of the poverty level The clinic cannot treat you if you are pregnant or think you are pregnant  Sexually transmitted diseases are not treated at the clinic.    Dental Care: Organization         Address  Phone  Notes  [facility] Department of [facility] [address] [place], [place] [phone] Accepts children up to age 46 who are enrolled in [place] or [place] Health Choice; pregnant women with a Medicaid card; and children who have applied for Medicaid or [place] Health Choice, but were declined, whose parents can pay a reduced fee at time of service.  [facility] Department of [facility]  [address], [place] [phone] Accepts children up to age 48 who are enrolled in [place] or [place] Health Choice; pregnant women with a Medicaid card; and children who have applied for Medicaid or [place] Health Choice, but were  declined, whose parents can pay a reduced fee at time of service.  [facility]  [address] [place], [place] [phone] Patients are seen by appointment only. Walk-ins are not accepted. [facility] will see patients 90 years of age and older. Monday - Tuesday (8am-5pm) Most Wednesdays (8:30-5pm) $30 per visit, cash only  [facility] Adult Dental Access PROGRAM  [address], [facility] [phone] Patients are seen by appointment only. Walk-ins are not accepted. [facility] will see patients 52 years of age and older. One  Wednesday Evening (Monthly: Volunteer Based).  $30 per visit, cash only  [employer] of Dentistry Clinics  [phone] for adults; Children under age 77, call Graduate Pediatric Dentistry at [phone]. Children aged 51-14, please call [phone] to request a pediatric application.  Dental services are provided in all areas of dental care including fillings, crowns and bridges, complete and partial dentures, implants, gum treatment, root canals, and extractions. Preventive care is also provided. Treatment is provided to both adults and children. Patients are selected via a lottery and there is often a waiting list.   [facility] [address], [place]  [phone] [email or web address]   [facility] [address] [place], [place] [phone], Ext. [phone] Second and Fourth Thursday of each month, opens at 6:30 AM; Clinic ends at 9 AM.  Patients are seen on a first-come first-served basis, and a limited number are seen during each clinic.   [facility]  [address] Ether [place], [place] [phone]   Eligibility Requirements You must have lived in [place], [place], or [place] counties for at least the last three months.   You cannot be eligible for state or federal sponsored [place], including [employer], [place], or [employer].   You generally cannot be  eligible for healthcare insurance through your employer.    How to apply: Eligibility screenings are held every Tuesday and Wednesday afternoon from 1:00 pm until 4:00 pm. You do not need an appointment for the interview!  [facility] [address] [place], [place] [phone]   [facility] Health Department  [phone]   [facility] Health Department  [phone]   [facility] Health Department  [phone]    Behavioral Health Resources in the Community: Intensive Outpatient Programs Organization         Address  Phone  Notes  [facility] Services [address] [place], [place] [phone]   [facility] Outpatient [address], [place], [place] [phone]   [facility] [address], [place], [place]  [phone]   [facility] [address]  [place], [place] [phone] or [phone]   Substance Abuse Resources Organization         Address  Phone  Notes  [facility]  [phone]   [facility]  [phone]   [facility] [place]  [phone]   Floydene Flock  [phone]   Residential & Outpatient Substance Abuse Program  [phone]   Psychological Services Organization         Address  Phone  Notes  [facility]  [phone]   [facility]  [phone]   [facility] [address] [place] [phone] or [phone]    Mobile Crisis Teams Organization         Address  Phone  Notes  [facility], Mobile Crisis Care Unit  [phone]   [facility]  [address] [place], [place] [phone]   Doristine Locks [address] [place] [phone]    Self-Help/Support Groups Organization         Address  Phone             Notes  [facility] [place] - variety of support groups  336- I7437963 Call for more information    Narcotics Anonymous (NA), [facility] [address], [employer] [place]  2 meetings at this  location   Residential Treatment Programs Organization         Address  Phone  Notes  [facility] [address]    [place]  [phone]   [facility]  [address], [place] [address], [place], [place] [phone]   [facility] [address] [place], [place] [phone] Admissions: 8am-3pm M-F  [facility] [address]    [place], [place] [phone]   [facility] [address] [place], [place], [place] [phone]   [facility] [address]  [place], [place] [phone]   [facility] - Intensive Outpatient [address] Laurell Josephs [address], [place], [place] [phone]   [facility] ([facility] [address] [place]  [place], [place] [phone] or [phone]   [facility] ([facility]) [address] [place], [place] [phone] Accepts Medicaid  [facility] [facility] [address],  [place] [phone] Substance Abuse/Addiction Treatment   [facility]         Address  Phone  Notes  [facility]  [phone]   Angie Fava, PhD [address] Ervin Knack [place], [place]   [phone] or [phone]   [facility] Behavioral   [address] [place], [place] [phone]   [facility] [address], [place], [place] [phone] Insurance/Medicaid/sponsorship through [facility] and Families [address]                                    [place], [place] [phone] Therapy/tele-psych/case  [facility] [address], [place] [phone]    Dr. Lolly Mustache  [phone]   [facility]  [employer] [facility] Dept. 1) [address] [place] 2) [address], [place] 3)  [address] [place] [address], [place] [phone]  [phone]   [facility] Child Abuse Hotline [phone] 242-8473 or [phone] (After Hours)

## 2015-09-12 ENCOUNTER — Encounter (HOSPITAL_BASED_OUTPATIENT_CLINIC_OR_DEPARTMENT_OTHER): Payer: Self-pay | Admitting: *Deleted

## 2015-09-12 ENCOUNTER — Emergency Department (HOSPITAL_BASED_OUTPATIENT_CLINIC_OR_DEPARTMENT_OTHER)
Admission: EM | Admit: 2015-09-12 | Discharge: 2015-09-12 | Disposition: A | Discharge status: Home / Self Care | Payer: BLUE CROSS/BLUE SHIELD | Attending: Emergency Medicine | Admitting: Emergency Medicine

## 2015-09-12 DIAGNOSIS — Z7952 Long term (current) use of systemic steroids: Secondary | ICD-10-CM | POA: Insufficient documentation

## 2015-09-12 DIAGNOSIS — Z79899 Other long term (current) drug therapy: Secondary | ICD-10-CM | POA: Diagnosis not present

## 2015-09-12 DIAGNOSIS — Z7951 Long term (current) use of inhaled steroids: Secondary | ICD-10-CM | POA: Diagnosis not present

## 2015-09-12 DIAGNOSIS — M545 Low back pain, unspecified: Secondary | ICD-10-CM

## 2015-09-12 DIAGNOSIS — M549 Dorsalgia, unspecified: Secondary | ICD-10-CM | POA: Diagnosis present

## 2015-09-12 DIAGNOSIS — F17210 Nicotine dependence, cigarettes, uncomplicated: Secondary | ICD-10-CM | POA: Insufficient documentation

## 2015-09-12 MED ORDER — NAPROXEN 500 MG PO TABS: 500.0000 mg | ORAL_TABLET | Freq: Two times a day (BID) | ORAL | Status: DC

## 2015-09-12 MED ORDER — CYCLOBENZAPRINE HCL 10 MG PO TABS: 10.0000 mg | ORAL_TABLET | Freq: Two times a day (BID) | ORAL | Status: DC | PRN

## 2015-09-12 MED ORDER — OXYCODONE-ACETAMINOPHEN 5-325 MG PO TABS: 2.0000 | ORAL_TABLET | ORAL | Status: DC | PRN

## 2015-09-12 MED FILL — NAPROXEN 500 MG TABLET: 500 | 15 days supply | Qty: 30 | Fill #0

## 2015-09-12 MED FILL — CYCLOBENZAPRINE 10 MG TAB: 10 | 10 days supply | Qty: 20 | Fill #0

## 2015-09-12 MED FILL — OXYCODONE/APAP 5-325: 5-325 | 1 days supply | Qty: 6 | Fill #0

## 2015-09-12 NOTE — ED Notes (Signed)
Hx of back pain with flare up starting on Saturday. Went to chiropractor yesterday with no relief

## 2015-09-12 NOTE — Discharge Instructions (Signed)
°  Back Pain, Adult Follow-up with the primary care doctor that I have referred you to today. Take medications as prescribed. Do not operate machinery, drive, or work when using narcotic pain medication or muscle relaxers. Return for any bowel or bladder incontinence or retention, lower extremity weakness or numbness. Back pain is very common. The pain often gets better over time. The cause of back pain is usually not dangerous. Most people can learn to manage their back pain on their own.  HOME CARE  Watch your back pain for any changes. The following actions may help to lessen any pain you are feeling:  Stay active. Start with short walks on flat ground if you can. Try to walk farther each day.  Exercise regularly as told by your doctor. Exercise helps your back heal faster. It also helps avoid future injury by keeping your muscles strong and flexible.  Do not sit, drive, or stand in one place for more than 30 minutes.  Do not stay in bed. Resting more than 1-2 days can slow down your recovery.  Be careful when you bend or lift an object. Use good form when lifting:  Bend at your knees.  Keep the object close to your body.  Do not twist.  Sleep on a firm mattress. Lie on your side, and bend your knees. If you lie on your back, put a pillow under your knees.  Take medicines only as told by your doctor.  Put ice on the injured area.  Put ice in a plastic bag.  Place a towel between your skin and the bag.  Leave the ice on for 20 minutes, 2-3 times a day for the first 2-3 days. After that, you can switch between ice and heat packs.  Avoid feeling anxious or stressed. Find good ways to deal with stress, such as exercise.  Maintain a healthy weight. Extra weight puts stress on your back. GET HELP IF:   You have pain that does not go away with rest or medicine.  You have worsening pain that goes down into your legs or buttocks.  You have pain that does not get better in one  week.  You have pain at night.  You lose weight.  You have a fever or chills. GET HELP RIGHT AWAY IF:   You cannot control when you poop (bowel movement) or pee (urinate).  Your arms or legs feel weak.  Your arms or legs lose feeling (numbness).  You feel sick to your stomach (nauseous) or throw up (vomit).  You have belly (abdominal) pain.  You feel like you may pass out (faint).   This information is not intended to replace advice given to you by your health care provider. Make sure you discuss any questions you have with your health care provider.   Document Released: 11/20/2007 Document Revised: 06/24/2014 Document Reviewed: 10/05/2013 Elsevier Interactive Patient Education [employer]2016 Elsevier Inc.

## 2015-09-12 NOTE — ED Provider Notes (Signed)
CSN: [id number]     Arrival date & time 09/12/15  [id number] History   First MD Initiated Contact with Patient 09/12/15 [phone]     Chief Complaint  Patient presents with  . Back Pain   (Consider location/radiation/quality/duration/timing/severity/associated sxs/prior Treatment) Patient is a 40 y.o. male presenting with back pain. The history is provided by the patient. No language interpreter was used.  Back Pain Mr. Jared Reynolds is a 40 year old male with no past medical history who presents with back pain That originally began at the beginning of last month. [facility]. She reports that he had an injury to his back 15 years ago but did not have any problems since then until recently. He was lifting something at that time which caused his back pain. He now states that 3 weeks ago his back pain again flaring up again. He was seen by a chiropractor yesterday and did not get any relief. He [facility] but states that that pain medication did not touch his back pain. Pain is worse with movement. He states he cannot get comfortable in any position. Has been wearing a back brace and applying a lidocaine patch to the back with little relief. He also reports that he has no more time off from work and needs a work note for 8 days.  He denies any fever, recent injury, lower extremity weakness or numbness, bowel or bladder incontinence or retention, IV drug use, or history of cancer.    History reviewed. No pertinent past medical history. Past Surgical History  Procedure Laterality Date  . Finger amputation      left hand index finger 2000  . Wisdom tooth extraction     No family history on file. Social History  Substance Use Topics  . Smoking status: Light Tobacco Smoker -- 0.50 packs/day    Types: Cigars  . Smokeless tobacco: Never Used  . Alcohol Use: Yes     Comment: 1 beer/ day    Review of Systems  Musculoskeletal: Positive for back pain. Negative for  gait problem and neck pain.  All other systems reviewed and are negative.     Allergies  Review of patient's allergies indicates no known allergies.  Home Medications   Prior to Admission medications   Medication Sig Start Date End Date Taking? Authorizing Provider  Aspirin-Caffeine (BAYER BACK & BODY PAIN EX ST) 500-32.5 MG TABS Take 1 tablet by mouth every 8 (eight) hours as needed (back pain).    Historical Provider, MD  cyclobenzaprine (FLEXERIL) 10 MG tablet Take 1 tablet (10 mg total) by mouth 2 (two) times daily as needed for muscle spasms. 09/12/15   Bianney Rockwood Patel-Mills, PA-C  doxycycline (VIBRAMYCIN) 100 MG capsule Take 1 capsule (100 mg total) by mouth 2 (two) times daily. 03/28/13   Tiffany Neva SeatGreene, PA-C  methocarbamol (ROBAXIN) 500 MG tablet Take 1 tablet (500 mg total) by mouth 2 (two) times daily. 07/20/14   Garlon HatchetLisa M Sanders, PA-C  naproxen (NAPROSYN) 500 MG tablet Take 1 tablet (500 mg total) by mouth 2 (two) times daily. 09/12/15   Etana Beets Patel-Mills, PA-C  orphenadrine (NORFLEX) 100 MG tablet Take 1 tablet (100 mg total) by mouth 2 (two) times daily. 08/05/15   Arby BarretteMarcy Pfeiffer, MD  oxyCODONE-acetaminophen (PERCOCET/ROXICET) 5-325 MG tablet Take 2 tablets by mouth every 4 (four) hours as needed for severe pain. 09/12/15   Shekela Goodridge Patel-Mills, PA-C  pantoprazole (PROTONIX) 20 MG tablet Take 1 tablet (20 mg  total) by mouth daily. 08/05/15   Arby Barrette, MD  predniSONE (DELTASONE) 10 MG tablet Take 1 tablet (10 mg total) by mouth daily. 03/28/13   Tiffany Neva Seat, PA-C  traMADol (ULTRAM) 50 MG tablet Take 2 tablets (100 mg total) by mouth every 6 (six) hours as needed. 08/05/15   Arby Barrette, MD  triamcinolone (NASACORT) 55 MCG/ACT nasal inhaler Place 2 sprays into the nose daily. 10/07/11 10/06/12  Shari Upstill, PA-C   BP 140/92 mmHg  Pulse 84  Temp(Src) 98.4 F (36.9 C) (Oral)  Resp 16  Ht 6' 4.5" (1.943 m)  Wt 108.863 kg  BMI 28.84 kg/m2  SpO2 100% Physical Exam  Constitutional:  He is oriented to person, place, and time. He appears well-developed and well-nourished. No distress.  HENT:  Head: Normocephalic and atraumatic.  Eyes: Conjunctivae are normal.  Neck: Normal range of motion. Neck supple.  Cardiovascular: Normal rate.   Pulmonary/Chest: Effort normal. No respiratory distress.  Musculoskeletal:  No lower extremity numbness or weakness. Ambulatory with steady gait. No reproducible midline or paravertebral lumbar, thoracic, or cervical tenderness on exam. No saddle anesthesia. Able to dorsi and plantar flex without difficulty. No foot drop. No rash or erythema.  Neurological: He is alert and oriented to person, place, and time.  Skin: Skin is warm and dry.  Nursing note and vitals reviewed.   ED Course  Procedures (including critical care time) Labs Review Labs Reviewed - No data to display  Imaging Review No results found.    EKG Interpretation None      MDM   Final diagnoses:  Bilateral low back pain without sciatica  Patient presents for back pain that began almost 2 months ago. Has intermittent back pain since then. States tramadol did not work for his pain. Is requesting a work note. Has no concerning findings on exam. I do not believe x-ray is warranted at this time. I discussed following up with a primary care physician and he was given a referral to one. I also explained that I would not give him a work note for 8 days. Return precautions discussed and patient agrees the plan. Medications - No data to display Filed Vitals:   09/12/15 0940  BP: 140/92  Pulse: 84  Temp: 98.4 F (36.9 C)  Resp: [address], PA-C 09/12/15 1028  Geoffery Lyons, MD 09/12/15 1615

## 2016-09-18 ENCOUNTER — Emergency Department (HOSPITAL_BASED_OUTPATIENT_CLINIC_OR_DEPARTMENT_OTHER)
Admission: EM | Admit: 2016-09-18 | Discharge: 2016-09-18 | Disposition: A | Discharge status: Home / Self Care | Payer: BLUE CROSS/BLUE SHIELD | Attending: Emergency Medicine | Admitting: Emergency Medicine

## 2016-09-18 ENCOUNTER — Encounter (HOSPITAL_BASED_OUTPATIENT_CLINIC_OR_DEPARTMENT_OTHER): Payer: Self-pay | Admitting: *Deleted

## 2016-09-18 DIAGNOSIS — Z79899 Other long term (current) drug therapy: Secondary | ICD-10-CM | POA: Insufficient documentation

## 2016-09-18 DIAGNOSIS — F17290 Nicotine dependence, other tobacco product, uncomplicated: Secondary | ICD-10-CM | POA: Diagnosis not present

## 2016-09-18 DIAGNOSIS — J029 Acute pharyngitis, unspecified: Secondary | ICD-10-CM | POA: Diagnosis present

## 2016-09-18 DIAGNOSIS — F1290 Cannabis use, unspecified, uncomplicated: Secondary | ICD-10-CM | POA: Diagnosis not present

## 2016-09-18 DIAGNOSIS — J020 Streptococcal pharyngitis: Secondary | ICD-10-CM | POA: Diagnosis not present

## 2016-09-18 LAB — RAPID STREP SCREEN (MED CTR MEBANE ONLY): Streptococcus, Group A Screen (Direct): POSITIVE — AB

## 2016-09-18 MED ORDER — IBUPROFEN 800 MG PO TABS
800.0000 mg | ORAL_TABLET | Freq: Once | ORAL | Status: AC
  Administered 2016-09-18: 800 mg via ORAL
  Filled 2016-09-18: qty 1

## 2016-09-18 MED ORDER — GI COCKTAIL ~~LOC~~
30.0000 mL | Freq: Once | ORAL | Status: AC
  Administered 2016-09-18: 30 mL via ORAL
  Filled 2016-09-18: qty 30

## 2016-09-18 MED ORDER — PENICILLIN G BENZATHINE 1200000 UNIT/2ML IM SUSP
1.2000 10*6.[IU] | Freq: Once | INTRAMUSCULAR | Status: AC
  Administered 2016-09-18: 1.2 10*6.[IU] via INTRAMUSCULAR
  Filled 2016-09-18: qty 2

## 2016-09-18 NOTE — Discharge Instructions (Signed)
Take tylenol, motrin for pain or sore throat.   Your sore throat should improve in 1-2 days.   See your doctor   Return to ER if you have worse sore throat, throat swelling, trouble swallowing, drooling, fevers

## 2016-09-18 NOTE — ED Triage Notes (Signed)
Pt reports sore throat since Sunday.  States tenderness on palpation since Monday.  Denies fever.

## 2016-09-18 NOTE — ED Provider Notes (Signed)
[facility] Provider [facility]   CSN: [id number] Arrival date & time: 09/18/16  1027     History   Chief Complaint Chief Complaint  Patient presents with  . Sore Throat    HPI Jared Reynolds is a 41 y.o. male otherwise here presenting with sore throat. Sore throat for the last 3 days. That is worse on the left side of his throat. States that he has some enlarged lymph nodes on his neck that has been more tender to touch. Denies fevers. Denies ear pain. Denies trouble swallowing or breathing. No hx of strep throat. Denies cough.   The history is provided by the patient.    History reviewed. No pertinent past medical history.  There are no active problems to display for this patient.   Past Surgical History:  Procedure Laterality Date  . FINGER AMPUTATION     left hand index finger 2000  . WISDOM TOOTH EXTRACTION         Home Medications    Prior to Admission medications   Medication Sig Start Date End Date Taking? Authorizing Provider  traMADol (ULTRAM) 50 MG tablet Take 2 tablets (100 mg total) by mouth every 6 (six) hours as needed. 08/05/15   Arby Barrette, MD  triamcinolone (NASACORT) 55 MCG/ACT nasal inhaler Place 2 sprays into the nose daily. 10/07/11 10/06/12  Elpidio Anis, PA-C    Family History History reviewed. No pertinent family history.  Social History Social History  Substance Use Topics  . Smoking status: Light Tobacco Smoker    Packs/day: 0.50    Types: Cigars  . Smokeless tobacco: Never Used  . Alcohol use Yes     Comment: 1 beer/ day     Allergies   Patient has no known allergies.   Review of Systems Review of Systems  HENT: Positive for sore throat.   All other systems reviewed and are negative.    Physical Exam Updated Vital Signs BP 125/88 (BP Location: Right Arm)   Pulse 82   Temp 98.2 F (36.8 C) (Oral)   Resp 18   Ht 6' 4.5" (1.943 m)   Wt 240 lb (108.9 kg)   SpO2 98%   BMI 28.83 kg/m   Physical Exam    Constitutional: He is oriented to person, place, and time. He appears well-developed and well-nourished.  HENT:  Head: Normocephalic.  Right Ear: External ear normal.  Left Ear: External ear normal.  TM nl bilaterally. Bilateral tonsils slightly enlarged with exudates. L tonsil slightly larger than right but uvula midline. No soft palate swelling to suggest PTA.   Eyes: EOM are normal. Pupils are equal, round, and reactive to light.  Neck: Normal range of motion.  Mild bilateral cervical LAD, no stridor   Cardiovascular: Normal rate, regular rhythm and normal heart sounds.   Pulmonary/Chest: Effort normal and breath sounds normal. No respiratory distress. He has no wheezes.  Abdominal: Soft. Bowel sounds are normal. He exhibits no distension. There is no tenderness.  Musculoskeletal: Normal range of motion.  Neurological: He is alert and oriented to person, place, and time.  Skin: Skin is warm.  Psychiatric: He has a normal mood and affect.  Nursing note and vitals reviewed.    ED Treatments / Results  Labs (all labs ordered are listed, but only abnormal results are displayed) Labs Reviewed  RAPID STREP SCREEN (NOT AT [facility]) - Abnormal; Notable for the following:       Result Value   Streptococcus, Group A Screen (Direct)  POSITIVE (*)    All other components within normal limits    EKG  EKG Interpretation None       Radiology No results found.  Procedures Procedures (including critical care time)  Medications Ordered in ED Medications  penicillin g benzathine (BICILLIN LA) 1200000 UNIT/2ML injection 1.2 Million Units (not administered)  gi cocktail (Maalox,Lidocaine,Donnatal) (30 mLs Oral Given 09/18/16 1047)  ibuprofen (ADVIL,MOTRIN) tablet 800 mg (800 mg Oral Given 09/18/16 1046)     Initial Impression / Assessment and Plan / ED Course  I have reviewed the triage vital signs and the nursing notes.  Pertinent labs & imaging results that were available during my  care of the patient were reviewed by me and considered in my medical decision making (see chart for details).     Jared Reynolds is a 41 y.o. male here with bilateral tonsillar exudates. Afebrile, well appearing. L tonsil slightly larger but no evidence of PTA. I think likely mono (viral), less likely strep. Low centor score so if rapid strep neg, will hold off on abx. Will give motrin.  11:46 AM Rapid strep positive. Offered amoxicillin vs bicillin and he chose to get bicillin shot. No PCN allergy. Given strict return precautions.     Final Clinical Impressions(s) / ED Diagnoses   Final diagnoses:  None    New Prescriptions New Prescriptions   No medications on file     Charlynne Pander, MD 09/18/16 1146

## 2017-11-05 ENCOUNTER — Emergency Department (HOSPITAL_BASED_OUTPATIENT_CLINIC_OR_DEPARTMENT_OTHER): Payer: BLUE CROSS/BLUE SHIELD

## 2017-11-05 ENCOUNTER — Encounter (HOSPITAL_BASED_OUTPATIENT_CLINIC_OR_DEPARTMENT_OTHER): Payer: Self-pay | Admitting: Emergency Medicine

## 2017-11-05 ENCOUNTER — Other Ambulatory Visit: Payer: Self-pay

## 2017-11-05 ENCOUNTER — Emergency Department (HOSPITAL_BASED_OUTPATIENT_CLINIC_OR_DEPARTMENT_OTHER)
Admission: EM | Admit: 2017-11-05 | Discharge: 2017-11-05 | Disposition: A | Discharge status: Home / Self Care | Payer: BLUE CROSS/BLUE SHIELD | Attending: Emergency Medicine | Admitting: Emergency Medicine

## 2017-11-05 DIAGNOSIS — R51 Headache: Secondary | ICD-10-CM | POA: Insufficient documentation

## 2017-11-05 DIAGNOSIS — F17290 Nicotine dependence, other tobacco product, uncomplicated: Secondary | ICD-10-CM | POA: Diagnosis not present

## 2017-11-05 DIAGNOSIS — R519 Headache, unspecified: Secondary | ICD-10-CM

## 2017-11-05 DIAGNOSIS — R110 Nausea: Secondary | ICD-10-CM | POA: Diagnosis not present

## 2017-11-05 DIAGNOSIS — H53149 Visual discomfort, unspecified: Secondary | ICD-10-CM | POA: Diagnosis not present

## 2017-11-05 DIAGNOSIS — R0789 Other chest pain: Secondary | ICD-10-CM | POA: Diagnosis not present

## 2017-11-05 DIAGNOSIS — R079 Chest pain, unspecified: Secondary | ICD-10-CM | POA: Diagnosis not present

## 2017-11-05 LAB — BASIC METABOLIC PANEL
Anion gap: 8 (ref 5–15)
BUN: 11 mg/dL (ref 6–20)
CO2: 25 mmol/L (ref 22–32)
Calcium: 9.5 mg/dL (ref 8.9–10.3)
Chloride: 105 mmol/L (ref 101–111)
Creatinine, Ser: 0.84 mg/dL (ref 0.61–1.24)
GFR calc Af Amer: >60 mL/min (ref >60)
GFR calc non Af Amer: >60 mL/min (ref >60)
Glucose, Bld: 119 mg/dL — ABNORMAL HIGH (ref 65–99)
Potassium: 3.6 mmol/L (ref 3.5–5.1)
Sodium: 138 mmol/L (ref 135–145)

## 2017-11-05 LAB — CBC WITH DIFFERENTIAL/PLATELET
Basophils Absolute: 0 10*3/uL (ref 0.0–0.1)
Basophils Relative: 0 %
Eosinophils Absolute: 0.2 10*3/uL (ref 0.0–0.7)
Eosinophils Relative: 2 %
HCT: 42.1 % (ref 39.0–52.0)
Hemoglobin: 15.2 g/dL (ref 13.0–17.0)
Lymphocytes Relative: 32 %
Lymphs Abs: 2.2 10*3/uL (ref 0.7–4.0)
MCH: 31.9 pg (ref 26.0–34.0)
MCHC: 36.1 g/dL — ABNORMAL HIGH (ref 30.0–36.0)
MCV: 88.4 fL (ref 78.0–100.0)
Monocytes Absolute: 0.4 10*3/uL (ref 0.1–1.0)
Monocytes Relative: 6 %
Neutro Abs: 4.2 10*3/uL (ref 1.7–7.7)
Neutrophils Relative %: 60 %
Platelets: 236 10*3/uL (ref 150–400)
RBC: 4.76 MIL/uL (ref 4.22–5.81)
RDW: 13.8 % (ref 11.5–15.5)
WBC: 6.9 10*3/uL (ref 4.0–10.5)

## 2017-11-05 LAB — TROPONIN I: Troponin I: <0.03 ng/mL (ref <0.03)

## 2017-11-05 MED ORDER — KETOROLAC TROMETHAMINE 15 MG/ML IJ SOLN
15.0000 mg | Freq: Once | INTRAMUSCULAR | Status: AC
  Administered 2017-11-05: 15 mg via INTRAVENOUS
  Filled 2017-11-05: qty 1

## 2017-11-05 MED ORDER — DIPHENHYDRAMINE HCL 50 MG/ML IJ SOLN
25.0000 mg | Freq: Once | INTRAMUSCULAR | Status: AC
  Administered 2017-11-05: 25 mg via INTRAVENOUS
  Filled 2017-11-05: qty 1

## 2017-11-05 MED ORDER — METOCLOPRAMIDE HCL 5 MG/ML IJ SOLN
10.0000 mg | Freq: Once | INTRAMUSCULAR | Status: AC
  Administered 2017-11-05: 10 mg via INTRAVENOUS
  Filled 2017-11-05: qty 2

## 2017-11-05 MED ORDER — SODIUM CHLORIDE 0.9 % IV BOLUS
1000.0000 mL | Freq: Once | INTRAVENOUS | Status: AC
  Administered 2017-11-05: 1000 mL via INTRAVENOUS

## 2017-11-05 NOTE — ED Notes (Signed)
ED Provider at bedside. 

## 2017-11-05 NOTE — ED Triage Notes (Signed)
Patient reports right sided headache and left sided chest pain, arm weakness and numbness that began yesterday.  Reports chest pain as sharp.  Reports numbness in hands has subsided but he continues to have right sided headache. Reports dizziness last night with nausea.

## 2017-11-12 NOTE — ED Provider Notes (Signed)
[facility] EMERGENCY DEPARTMENT Provider Note   CSN: [id number] Arrival date & time: 11/05/17  1006     History   Chief Complaint Chief Complaint  Patient presents with  . Chest Pain  . Headache    HPI Jared Reynolds is a 42 y.o. male.  HPI  42 year old male with headaches.  Gradual onset yesterday.  Persistent since then.  Pain is diffuse but worse on the right side.  Feels achy.  Has been fairly constant.  No appreciable exacerbating relieving factors.  Associated with nausea mild photophobia.  No acute change in vision otherwise.  No fevers or chills.  No neck pain, neck stiffness or sore throat.  Additionally, he is complaining some chest pain.  Described as sharp.  Is been going on since yesterday as well.  Brief intermittent episodes lasting seconds stopped about a minute.  History reviewed. No pertinent past medical history.  There are no active problems to display for this patient.   Past Surgical History:  Procedure Laterality Date  . FINGER AMPUTATION     left hand index finger 2000  . WISDOM TOOTH EXTRACTION          Home Medications    Prior to Admission medications   Medication Sig Start Date End Date Taking? Authorizing Provider  traMADol (ULTRAM) 50 MG tablet Take 2 tablets (100 mg total) by mouth every 6 (six) hours as needed. 08/05/15   Arby Barrette, MD  triamcinolone (NASACORT) 55 MCG/ACT nasal inhaler Place 2 sprays into the nose daily. 10/07/11 10/06/12  Elpidio Anis, PA-C    Family History History reviewed. No pertinent family history.  Social History Social History   Tobacco Use  . Smoking status: Light Tobacco Smoker    Packs/day: 0.50    Types: Cigars  . Smokeless tobacco: Never Used  Substance Use Topics  . Alcohol use: Yes    Comment: 1 beer/ day  . Drug use: Yes    Types: Marijuana    Comment: occasional     Allergies   Patient has no known allergies.   Review of Systems Review of Systems  All systems  reviewed and negative, other than as noted in HPI.  Physical Exam Updated Vital Signs BP 105/76   Pulse 81   Temp 98.6 F (37 C) (Oral)   Resp 16   Ht 6' 4.5" (1.943 m)   Wt 108 kg (238 lb)   SpO2 99%   BMI 28.59 kg/m   Physical Exam  Constitutional: He is oriented to person, place, and time. He appears well-developed and well-nourished. No distress.  HENT:  Head: Normocephalic and atraumatic.  Eyes: Conjunctivae are normal. Right eye exhibits no discharge. Left eye exhibits no discharge.  Neck: Neck supple.  No nuchal rigidity  Cardiovascular: Normal rate, regular rhythm and normal heart sounds. Exam reveals no gallop and no friction rub.  No murmur heard. Pulmonary/Chest: Effort normal and breath sounds normal. No respiratory distress.  Abdominal: Soft. He exhibits no distension. There is no tenderness.  Musculoskeletal: He exhibits no edema or tenderness.  Neurological: He is alert and oriented to person, place, and time. He is not disoriented. No cranial nerve deficit.  Strength 5 out of 5 bilateral upper and lower extremities.  Sensation is intact to light touch.  Skin: Skin is warm and dry.  Psychiatric: He has a normal mood and affect. His behavior is normal. Thought content normal.  Nursing note and vitals reviewed.    ED Treatments / Results  Labs (all labs ordered are listed, but only abnormal results are displayed) Labs Reviewed  BASIC METABOLIC PANEL - Abnormal; Notable for the following components:      Result Value   Glucose, Bld 119 (*)    All other components within normal limits  CBC WITH DIFFERENTIAL/PLATELET - Abnormal; Notable for the following components:   MCHC 36.1 (*)    All other components within normal limits  TROPONIN I    EKG EKG Interpretation  Date/Time:  Wednesday Nov 05 2017 10:16:29 EDT Ventricular Rate:  79 PR Interval:    QRS Duration: 90 QT Interval:  377 QTC Calculation: 433 R Axis:   46 Text Interpretation:  Sinus  rhythm Non-specific ST-t changes No old tracing to compare Confirmed by Raeford Razor [phone]) on 11/05/2017 10:21:57 AM Also confirmed by Raeford Razor [phone]), editor Sheppard Evens ([id number])  on 11/06/2017 9:00:21 AM   Radiology No results found.  Procedures Procedures (including critical care time)  Medications Ordered in ED Medications  ketorolac (TORADOL) 15 MG/ML injection 15 mg (15 mg Intravenous Given 11/05/17 1042)  metoCLOPramide (REGLAN) injection 10 mg (10 mg Intravenous Given 11/05/17 1042)  diphenhydrAMINE (BENADRYL) injection 25 mg (25 mg Intravenous Given 11/05/17 1042)  sodium chloride 0.9 % bolus 1,000 mL (0 mLs Intravenous Stopped 11/05/17 1206)     Initial Impression / Assessment and Plan / ED Course  I have reviewed the triage vital signs and the nursing notes.  Pertinent labs & imaging results that were available during my care of the patient were reviewed by me and considered in my medical decision making (see chart for details).     42 year old male with chest pain and headaches.  I doubt directly related.  His chest pain sounds atypical for ACS.  Doubt PE, dissection or other emergent process.  He has a nonfocal neurological examination.  He denies any trauma.  Is afebrile.  He has no nuchal rigidity or acute visual complaints.  Headache markedly improved after medications.  Final Clinical Impressions(s) / ED Diagnoses   Final diagnoses:  Nonspecific chest pain  Nonintractable headache, unspecified chronicity pattern, unspecified headache type    ED Discharge Orders    None       Raeford Razor, MD 11/12/17 2132

## 2018-12-31 DIAGNOSIS — M79672 Pain in left foot: Secondary | ICD-10-CM | POA: Diagnosis not present

## 2018-12-31 DIAGNOSIS — M79671 Pain in right foot: Secondary | ICD-10-CM | POA: Diagnosis not present

## 2018-12-31 DIAGNOSIS — M21611 Bunion of right foot: Secondary | ICD-10-CM | POA: Diagnosis not present

## 2018-12-31 DIAGNOSIS — M21612 Bunion of left foot: Secondary | ICD-10-CM | POA: Diagnosis not present

## 2018-12-31 DIAGNOSIS — B079 Viral wart, unspecified: Secondary | ICD-10-CM | POA: Diagnosis not present

## 2019-01-14 DIAGNOSIS — B079 Viral wart, unspecified: Secondary | ICD-10-CM | POA: Diagnosis not present

## 2019-01-15 DIAGNOSIS — F17200 Nicotine dependence, unspecified, uncomplicated: Secondary | ICD-10-CM | POA: Diagnosis not present

## 2019-01-15 DIAGNOSIS — R5383 Other fatigue: Secondary | ICD-10-CM | POA: Diagnosis not present

## 2019-01-15 DIAGNOSIS — E663 Overweight: Secondary | ICD-10-CM | POA: Diagnosis not present

## 2019-01-15 DIAGNOSIS — R350 Frequency of micturition: Secondary | ICD-10-CM | POA: Diagnosis not present

## 2019-01-19 DIAGNOSIS — Z136 Encounter for screening for cardiovascular disorders: Secondary | ICD-10-CM | POA: Diagnosis not present

## 2019-01-19 DIAGNOSIS — R5383 Other fatigue: Secondary | ICD-10-CM | POA: Diagnosis not present

## 2019-01-19 DIAGNOSIS — Z131 Encounter for screening for diabetes mellitus: Secondary | ICD-10-CM | POA: Diagnosis not present

## 2019-01-19 DIAGNOSIS — R350 Frequency of micturition: Secondary | ICD-10-CM | POA: Diagnosis not present

## 2019-01-25 DIAGNOSIS — R350 Frequency of micturition: Secondary | ICD-10-CM | POA: Diagnosis not present

## 2019-01-25 DIAGNOSIS — E785 Hyperlipidemia, unspecified: Secondary | ICD-10-CM | POA: Diagnosis not present

## 2019-01-25 DIAGNOSIS — F17200 Nicotine dependence, unspecified, uncomplicated: Secondary | ICD-10-CM | POA: Diagnosis not present

## 2019-01-25 DIAGNOSIS — N529 Male erectile dysfunction, unspecified: Secondary | ICD-10-CM | POA: Diagnosis not present

## 2019-01-28 DIAGNOSIS — B079 Viral wart, unspecified: Secondary | ICD-10-CM | POA: Diagnosis not present

## 2019-01-29 ENCOUNTER — Ambulatory Visit (HOSPITAL_COMMUNITY)
Admission: EM | Admit: 2019-01-29 | Discharge: 2019-01-29 | Disposition: A | Discharge status: Home / Self Care | Payer: BC Managed Care – PPO | Attending: Urgent Care | Admitting: Urgent Care

## 2019-01-29 ENCOUNTER — Encounter (HOSPITAL_COMMUNITY): Payer: Self-pay | Admitting: Urgent Care

## 2019-01-29 ENCOUNTER — Other Ambulatory Visit: Payer: Self-pay

## 2019-01-29 DIAGNOSIS — Z20828 Contact with and (suspected) exposure to other viral communicable diseases: Secondary | ICD-10-CM | POA: Diagnosis not present

## 2019-01-29 DIAGNOSIS — Z20822 Contact with and (suspected) exposure to covid-19: Secondary | ICD-10-CM

## 2019-01-29 NOTE — ED Triage Notes (Signed)
Pt present he was recently exposed to someone who tested positive for covid 19.

## 2019-01-29 NOTE — ED Provider Notes (Signed)
  MRN: [id number] DOB: 10-23-1975  Subjective:   Jared Reynolds is a 43 y.o. male presenting for COVID-19 testing.  Patient was in close contact with a friend turned out to be positive, was just informed that he had COVID-19.  Patient is currently asymptomatic.  However, he would like to make sure he does not have COVID-19 given that he has kids.  No current facility-administered medications for this encounter.   Current Outpatient Medications:  .  traMADol (ULTRAM) 50 MG tablet, Take 2 tablets (100 mg total) by mouth every 6 (six) hours as needed., Disp: 30 tablet, Rfl: 0 .  triamcinolone (NASACORT) 55 MCG/ACT nasal inhaler, Place 2 sprays into the nose daily., Disp: 1 Inhaler, Rfl: 12   No Known Allergies  History reviewed. No pertinent past medical history.   Past Surgical History:  Procedure Laterality Date  . FINGER AMPUTATION     left hand index finger 2000  . WISDOM TOOTH EXTRACTION      Review of Systems  Constitutional: Negative for fever and malaise/fatigue.  HENT: Negative for congestion, ear pain, sinus pain and sore throat.   Eyes: Negative for blurred vision, double vision, discharge and redness.  Respiratory: Negative for cough, hemoptysis, shortness of breath and wheezing.   Cardiovascular: Negative for chest pain.  Gastrointestinal: Negative for abdominal pain, diarrhea, nausea and vomiting.  Genitourinary: Negative for dysuria, flank pain and hematuria.  Musculoskeletal: Negative for myalgias.  Skin: Negative for rash.  Neurological: Negative for dizziness, weakness and headaches.  Psychiatric/Behavioral: Negative for depression and substance abuse.    Objective:   Vitals: BP 132/81 (BP Location: Left Arm)   Pulse 80   Temp 98.5 F (36.9 C) (Oral)   Resp 16   SpO2 100%   Physical Exam Constitutional:      Appearance: Normal appearance. He is well-developed and normal weight.  HENT:     Head: Normocephalic and atraumatic.     Right Ear: External ear  normal.     Left Ear: External ear normal.     Nose: Nose normal.     Mouth/Throat:     Pharynx: Oropharynx is clear.  Eyes:     Extraocular Movements: Extraocular movements intact.     Pupils: Pupils are equal, round, and reactive to light.  Cardiovascular:     Rate and Rhythm: Normal rate.  Pulmonary:     Effort: Pulmonary effort is normal.  Neurological:     Mental Status: He is alert and oriented to person, place, and time.  Psychiatric:        Mood and Affect: Mood normal.        Behavior: Behavior normal.        Thought Content: Thought content normal.        Judgment: Judgment normal.      Assessment and Plan :   1. Exposure to Covid-19 Virus     Counseled patient on nature of COVID-19 including modes of transmission, diagnostic testing, management and supportive care.  Counseled on medications used for symptomatic relief. COVID 19 testing is pending. Counseled patient on potential for adverse effects with medications prescribed/recommended today, ER and return-to-clinic precautions discussed, patient verbalized understanding.     Jaynee Eagles, [place] 01/29/19 Vernelle Emerald

## 2019-01-29 NOTE — Discharge Instructions (Signed)
We will manage this as a viral syndrome. For sore throat or cough try using a honey-based tea. Use 3 teaspoons of honey with juice squeezed from half lemon. Place shaved pieces of ginger into 1/2-1 cup of water and warm over stove top. Then mix the ingredients and repeat every 4 hours as needed. Please take Tylenol 500mg every 6 hours. Hydrate very well with at least 2 liters of water. Eat light meals such as soups to replenish electrolytes and soft fruits, veggies. Start an antihistamine like Zyrtec, Allegra or Claritin for postnasal drainage, sinus congestion.  

## 2019-01-31 LAB — NOVEL CORONAVIRUS, NAA (HOSP ORDER, SEND-OUT TO REF LAB; TAT 18-24 HRS): SARS-CoV-2, NAA: NOT DETECTED

## 2019-02-26 DIAGNOSIS — B079 Viral wart, unspecified: Secondary | ICD-10-CM | POA: Diagnosis not present

## 2019-03-15 DIAGNOSIS — D485 Neoplasm of uncertain behavior of skin: Secondary | ICD-10-CM | POA: Diagnosis not present

## 2019-04-28 DIAGNOSIS — M71571 Other bursitis, not elsewhere classified, right ankle and foot: Secondary | ICD-10-CM | POA: Diagnosis not present

## 2019-04-28 DIAGNOSIS — M71572 Other bursitis, not elsewhere classified, left ankle and foot: Secondary | ICD-10-CM | POA: Diagnosis not present

## 2019-04-28 DIAGNOSIS — B351 Tinea unguium: Secondary | ICD-10-CM | POA: Diagnosis not present

## 2019-04-28 DIAGNOSIS — M216X2 Other acquired deformities of left foot: Secondary | ICD-10-CM | POA: Diagnosis not present

## 2019-04-28 DIAGNOSIS — M216X1 Other acquired deformities of right foot: Secondary | ICD-10-CM | POA: Diagnosis not present

## 2019-05-17 ENCOUNTER — Ambulatory Visit: Payer: BC Managed Care – PPO | Admitting: Podiatry

## 2019-05-24 ENCOUNTER — Ambulatory Visit (INDEPENDENT_AMBULATORY_CARE_PROVIDER_SITE_OTHER): Payer: BC Managed Care – PPO

## 2019-05-24 ENCOUNTER — Other Ambulatory Visit: Payer: Self-pay | Admitting: Podiatry

## 2019-05-24 ENCOUNTER — Ambulatory Visit: Payer: BC Managed Care – PPO | Admitting: Podiatry

## 2019-05-24 ENCOUNTER — Encounter: Payer: Self-pay | Admitting: Podiatry

## 2019-05-24 ENCOUNTER — Other Ambulatory Visit: Payer: Self-pay

## 2019-05-24 VITALS — BP 152/84 | HR 75

## 2019-05-24 DIAGNOSIS — M7751 Other enthesopathy of right foot: Secondary | ICD-10-CM

## 2019-05-24 DIAGNOSIS — L989 Disorder of the skin and subcutaneous tissue, unspecified: Secondary | ICD-10-CM

## 2019-05-24 DIAGNOSIS — M79672 Pain in left foot: Secondary | ICD-10-CM

## 2019-05-24 DIAGNOSIS — M79671 Pain in right foot: Secondary | ICD-10-CM

## 2019-05-24 DIAGNOSIS — M7752 Other enthesopathy of left foot: Secondary | ICD-10-CM

## 2019-05-24 NOTE — Patient Instructions (Signed)
Pre-Operative Instructions  Congratulations, you have decided to take an important step towards improving your quality of life.  You can be assured that the doctors and staff at [facility] will be with you every step of the way.  Here are some important things you should know:  1. Plan to be at the surgery center/hospital at least 1 (one) hour prior to your scheduled time, unless otherwise directed by the surgical center/hospital staff.  You must have a responsible adult accompany you, remain during the surgery and drive you home.  Make sure you have directions to the surgical center/hospital to ensure you arrive on time. 2. If you are having surgery at [facility] or [facility] hospitals, you will need a copy of your medical history and physical form from your family physician within one month prior to the date of surgery. We will give you a form for your primary physician to complete.  3. We make every effort to accommodate the date you request for surgery.  However, there are times where surgery dates or times have to be moved.  We will contact you as soon as possible if a change in schedule is required.   4. No aspirin/ibuprofen for one week before surgery.  If you are on aspirin, any non-steroidal anti-inflammatory medications (Mobic, Aleve, Ibuprofen) should not be taken seven (7) days prior to your surgery.  You make take Tylenol for pain prior to surgery.  5. Medications - If you are taking daily heart and blood pressure medications, seizure, reflux, allergy, asthma, anxiety, pain or diabetes medications, make sure you notify the surgery center/hospital before the day of surgery so they can tell you which medications you should take or avoid the day of surgery. 6. No food or drink after midnight the night before surgery unless directed otherwise by surgical center/hospital staff. 7. No alcoholic beverages 24-hours prior to surgery.  No smoking 24-hours prior or 24-hours after  surgery. 8. Wear loose pants or shorts. They should be loose enough to fit over bandages, boots, and casts. 9. Don't wear slip-on shoes. Sneakers are preferred. 10. Bring your boot with you to the surgery center/hospital.  Also bring crutches or a walker if your physician has prescribed it for you.  If you do not have this equipment, it will be provided for you after surgery. 11. If you have not been contacted by the surgery center/hospital by the day before your surgery, call to confirm the date and time of your surgery. 12. Leave-time from work may vary depending on the type of surgery you have.  Appropriate arrangements should be made prior to surgery with your employer. 13. Prescriptions will be provided immediately following surgery by your doctor.  Fill these as soon as possible after surgery and take the medication as directed. Pain medications will not be refilled on weekends and must be approved by the doctor. 14. Remove nail polish on the operative foot and avoid getting pedicures prior to surgery. 15. Wash the night before surgery.  The night before surgery wash the foot and leg well with water and the antibacterial soap provided. Be sure to pay special attention to beneath the toenails and in between the toes.  Wash for at least three (3) minutes. Rinse thoroughly with water and dry well with a towel.  Perform this wash unless told not to do so by your physician.  Enclosed: 1 Ice pack (please put in freezer the night before surgery)   1 Hibiclens skin cleaner     Pre-op instructions  If you have any questions regarding the instructions, please do not hesitate to call our office.  [place]: [address], [place], [place] [address] -- [phone]  [place]: [address] [place], [place] [address] -- [phone]  [place]: [address]  [place], [place] [address] -- [phone]   Website: [email or web address] 

## 2019-05-26 NOTE — Progress Notes (Signed)
   Subjective: 43 y.o. male presenting to the office today as a new patient, referred by Dr. Gershon Mussel, with a chief complaint of painful callus lesions noted to the bilateral feet that have been present for the past several years. He states he has had them shaved down in the past but that is now causing more pain. He was advised he needed surgery. Walking and being on the feet increases the pain. Patient is here for further evaluation and treatment.   History reviewed. No pertinent past medical history.   Objective:  Physical Exam General: Alert and oriented x3 in no acute distress  Dermatology: Hyperkeratotic lesion(s) present on the sub 3rd MPJ of the left foot and the sub-fourth MPJ of the right foot. Pain on palpation with a central nucleated core noted. Skin is warm, dry and supple bilateral lower extremities. Negative for open lesions or macerations.  Vascular: Palpable pedal pulses bilaterally. No edema or erythema noted. Capillary refill within normal limits.  Neurological: Epicritic and protective threshold grossly intact bilaterally.   Musculoskeletal Exam: Pain on palpation at the keratotic lesion(s) noted as well as to the right 4th MPJ and left 3rd MPJ. Range of motion within normal limits bilateral. Muscle strength 5/5 in all groups bilateral.  Radiographic Exam:  Normal osseous mineralization. Joint spaces preserved. No fracture/dislocation/boney destruction.    Assessment: 1. Porokeratosis sub-third MPJ left 2. Porokeratosis sub-fourth MPJ right 3. 4th MPJ capsulitis right; 3rd MPJ capsulitis left    Plan of Care:  1. Patient evaluated. X-Rays reviewed.  2. Today we discussed the conservative versus surgical management of the presenting pathology. The patient opts for surgical management. All possible complications and details of the procedure were explained. All patient questions were answered. No guarantees were expressed or implied. 3. Authorization for surgery was  initiated today. Surgery will consist of Weil osteotomy 3rd left; 4th right.  4. Prescription for Diclofenac provided to patient. 5. Injection of 0.5 mLs Celestone Soluspan injected into the 3rd MPJ of the left foot.  6. Return to clinic one week post op.    Works at [employer].    Edrick Kins, DPM [facility]  Dr. Edrick Kins, [place]                                        [place], [place] [address]                Office [phone]  Fax [phone]

## 2019-05-27 ENCOUNTER — Telehealth: Payer: Self-pay | Admitting: Podiatry

## 2019-05-27 MED ORDER — DICLOFENAC SODIUM 75 MG PO TBEC: 75.0000 mg | DELAYED_RELEASE_TABLET | Freq: Two times a day (BID) | ORAL | 0 refills | Status: DC

## 2019-05-27 NOTE — Telephone Encounter (Signed)
I informed pt of Dr. Amalia Hailey order for an antiinflammatory pain medication sent to [facility].

## 2019-05-27 NOTE — Telephone Encounter (Signed)
Pt called to sat that  DR.evans was suppose to call in something for pain from office visit 05/24/2019 [facility] on [address]

## 2019-05-27 NOTE — Addendum Note (Signed)
Addended by: Harriett Sine D on: 05/27/2019 01:44 PM   Modules accepted: Orders

## 2019-06-01 ENCOUNTER — Encounter (HOSPITAL_COMMUNITY): Payer: Self-pay

## 2019-06-01 ENCOUNTER — Ambulatory Visit (HOSPITAL_COMMUNITY)
Admission: EM | Admit: 2019-06-01 | Discharge: 2019-06-01 | Disposition: A | Discharge status: Home / Self Care | Payer: BC Managed Care – PPO | Attending: Family Medicine | Admitting: Family Medicine

## 2019-06-01 ENCOUNTER — Other Ambulatory Visit: Payer: Self-pay

## 2019-06-01 DIAGNOSIS — Z20822 Contact with and (suspected) exposure to covid-19: Secondary | ICD-10-CM

## 2019-06-01 DIAGNOSIS — Z20828 Contact with and (suspected) exposure to other viral communicable diseases: Secondary | ICD-10-CM | POA: Diagnosis not present

## 2019-06-01 NOTE — ED Triage Notes (Signed)
Pt. States he was at a birthday party on Friday of his aunt & she states today she tested POSITIVE.  

## 2019-06-01 NOTE — Discharge Instructions (Addendum)
If your Covid-19 test is positive, you will receive a phone call from [facility] regarding your results. Negative test results are not called. Both positive and negative results area always visible on MyChart. If you do not have a MyChart account, sign up instructions are in your discharge papers.  

## 2019-06-01 NOTE — ED Provider Notes (Signed)
[facility]   [id number] 06/01/19 Arrival Time: 3329  ASSESSMENT & PLAN:  1. Exposure to COVID-19 virus      COVID-19 testing sent. To self-quarantine until results are available. If requested, work note provided.  Follow-up Information    Audley Hose, MD.   Specialty: Internal Medicine Why: As needed. Contact information: [place] [address] [phone]           Reviewed expectations re: course of current medical issues. Questions answered. Outlined signs and symptoms indicating need for more acute intervention. Patient verbalized understanding. After Visit Summary given.   SUBJECTIVE: History from: patient. Jared Reynolds is a 43 y.o. male who requests COVID-19 testing. Known COVID-19 contact: family member. Recent travel: none. Denies: runny nose, congestion, fever, cough, sore throat, difficulty breathing and headache. Normal PO intake without n/v/d.  ROS: As per HPI.   OBJECTIVE:  Vitals:   06/01/19 1138 06/01/19 1140  BP:  129/71  Pulse:  82  Resp:  17  Temp:  98.4 F (36.9 C)  TempSrc:  Oral  SpO2:  100%  Weight: 107 kg     General appearance: alert; no distress Eyes: PERRLA; EOMI; conjunctiva normal HENT: [place]; AT; nasal mucosa normal; oral mucosa normal Neck: supple  Lungs: speaks full sentences without difficulty; unlabored Heart: regular rate and rhythm Abdomen: soft, non-tender Extremities: no edema Skin: warm and dry Neurologic: normal gait Psychological: alert and cooperative; normal mood and affect  Labs:  Labs Reviewed  NOVEL CORONAVIRUS, NAA (HOSP ORDER, SEND-OUT TO REF LAB; TAT 18-24 HRS)     No Known Allergies  PMH: "Healthy".  Social History   Socioeconomic History  . Marital status: Legally Separated    Spouse name: Not on file  . Number of children: Not on file  . Years of education: Not on file  . Highest education level: Not on file  Occupational History  .  Not on file  Tobacco Use  . Smoking status: Light Tobacco Smoker    Packs/day: 0.50    Types: Cigars  . Smokeless tobacco: Never Used  Substance and Sexual Activity  . Alcohol use: Yes    Comment: 1 beer/ day  . Drug use: Yes    Types: Marijuana    Comment: occasional  . Sexual activity: Not on file  Other Topics Concern  . Not on file  Social History Narrative  . Not on file   Social Determinants of Health   Financial Resource Strain:   . Difficulty of Paying Living Expenses: Not on file  Food Insecurity:   . Worried About Charity fundraiser in the Last Year: Not on file  . Ran Out of Food in the Last Year: Not on file  Transportation Needs:   . Lack of Transportation (Medical): Not on file  . Lack of Transportation (Non-Medical): Not on file  Physical Activity:   . Days of Exercise per Week: Not on file  . Minutes of Exercise per Session: Not on file  Stress:   . Feeling of Stress : Not on file  Social Connections:   . Frequency of Communication with Friends and Family: Not on file  . Frequency of Social Gatherings with Friends and Family: Not on file  . Attends Religious Services: Not on file  . Active Member of Clubs or Organizations: Not on file  . Attends Archivist Meetings: Not on file  . Marital Status: Not on file  Intimate Partner Violence:   .  Fear of Current or Ex-Partner: Not on file  . Emotionally Abused: Not on file  . Physically Abused: Not on file  . Sexually Abused: Not on file    Past Surgical History:  Procedure Laterality Date  . FINGER AMPUTATION     left hand index finger 2000  . WISDOM TOOTH EXTRACTION       Mardella Layman, MD 06/01/19 1145

## 2019-06-03 LAB — NOVEL CORONAVIRUS, NAA (HOSP ORDER, SEND-OUT TO REF LAB; TAT 18-24 HRS): SARS-CoV-2, NAA: NOT DETECTED

## 2019-06-28 ENCOUNTER — Telehealth: Payer: Self-pay | Admitting: Podiatry

## 2019-06-28 NOTE — Telephone Encounter (Signed)
I spoke with pt and he requested Diclofenac refill. I asked pt if he is scheduled for suregery and he stated he is scheduled for surgery 07/08/2019. I told pt I would send his request to Dr. Logan Bores, to make sure the medication did not interfere with his scheduled surgery.

## 2019-06-28 NOTE — Telephone Encounter (Signed)
Pt called requesting a refill on his pain medication.   

## 2019-06-29 NOTE — Telephone Encounter (Signed)
Pt spoke with pt about FMLA paperwork and asked for more pain medication.

## 2019-06-30 ENCOUNTER — Other Ambulatory Visit: Payer: Self-pay | Admitting: Podiatry

## 2019-06-30 MED ORDER — DICLOFENAC SODIUM 75 MG PO TBEC: 75.0000 mg | DELAYED_RELEASE_TABLET | Freq: Two times a day (BID) | ORAL | 1 refills | Status: DC

## 2019-07-01 ENCOUNTER — Encounter (HOSPITAL_COMMUNITY): Payer: Self-pay

## 2019-07-01 ENCOUNTER — Other Ambulatory Visit: Payer: Self-pay

## 2019-07-01 ENCOUNTER — Ambulatory Visit (HOSPITAL_COMMUNITY)
Admission: EM | Admit: 2019-07-01 | Discharge: 2019-07-01 | Disposition: A | Discharge status: Home / Self Care | Payer: BC Managed Care – PPO | Attending: Family Medicine | Admitting: Family Medicine

## 2019-07-01 DIAGNOSIS — Z20822 Contact with and (suspected) exposure to covid-19: Secondary | ICD-10-CM | POA: Diagnosis not present

## 2019-07-01 NOTE — Discharge Instructions (Addendum)
  You must quarantine at home until your test result is available You can check for your test result in MyChart  

## 2019-07-01 NOTE — ED Provider Notes (Signed)
[facility]    CSN: [id number] Arrival date & time: 07/01/19  1005      History   Chief Complaint Chief Complaint  Patient presents with  . Covid Exposure    HPI Jared Reynolds is a 44 y.o. male.   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 No pertinent past medical history.  There are no problems to display for this patient.   Past Surgical History:  Procedure Laterality Date  . FINGER AMPUTATION     left hand index finger 2000  . WISDOM TOOTH EXTRACTION         Home Medications    Prior to Admission medications   Medication Sig Start Date End Date Taking? Authorizing Provider  diclofenac (VOLTAREN) 75 MG EC tablet Take 1 tablet (75 mg total) by mouth 2 (two) times daily. 06/30/19   Felecia Shelling, DPM  terbinafine (LAMISIL) 250 MG tablet Take 250 mg by mouth daily. 04/28/19   [provider]  triamcinolone (NASACORT) 55 MCG/ACT nasal inhaler Place 2 sprays into the nose daily. 10/07/11 07/01/19  Elpidio Anis, PA-C    Family History History reviewed. No pertinent family history.  Social History Social History   Tobacco Use  . Smoking status: Light Tobacco Smoker    Packs/day: 0.50    Types: Cigars  . Smokeless tobacco: Never Used  Substance Use Topics  . Alcohol use: Yes    Comment: 1 beer/ day  . Drug use: Yes    Types: Marijuana    Comment: occasional     Allergies   Patient has no known allergies.   Review of Systems Review of Systems  Constitutional: Negative for chills and fever.  Respiratory: Negative for shortness of breath.   Musculoskeletal: Negative for myalgias.  Neurological: Negative for headaches.     Physical Exam Triage Vital Signs ED Triage Vitals [07/01/19 1024]  Enc Vitals Group     BP (!) 147/78     Pulse Rate 78     Resp 18     Temp 98.1 F (36.7 C)     Temp Source Oral     SpO2 99 %     Weight      Height      Head Circumference      Peak  Flow      Pain Score 0     Pain Loc      Pain Edu?      Excl. in GC?    No data found.  Updated Vital Signs BP (!) 147/78 (BP Location: Left Arm)   Pulse 78   Temp 98.1 F (36.7 C) (Oral)   Resp 18   SpO2 99%   Visual Acuity Right Eye Distance:   Left Eye Distance:   Bilateral Distance:    Right Eye Near:   Left Eye Near:    Bilateral Near:     Physical Exam Constitutional:      General: He is not in acute distress.    Appearance: Normal appearance. He is well-developed and normal weight.  HENT:     Head: Normocephalic and atraumatic.     Mouth/Throat:     Comments: mask Cardiovascular:     Rate and Rhythm: Normal rate.  Pulmonary:     Effort: Pulmonary effort is normal. No respiratory distress.  Musculoskeletal:        General: Normal range of motion.     Cervical back: Normal range of  motion.  Skin:    General: Skin is warm and dry.  Neurological:     Mental Status: He is alert. Mental status is at baseline.     Gait: Gait normal.  Psychiatric:        Behavior: Behavior normal.      UC Treatments / Results  Labs (all labs ordered are listed, but only abnormal results are displayed) Labs Reviewed  NOVEL CORONAVIRUS, NAA (HOSP ORDER, SEND-OUT TO REF LAB; TAT 18-24 HRS)    EKG   Radiology No results found.  Procedures Procedures (including critical care time)  Medications Ordered in UC Medications - No data to display  Initial Impression / Assessment and Plan / UC Course  I have reviewed the triage vital signs and the nursing notes.  Pertinent labs & imaging results that were available during my care of the patient were reviewed by me and considered in my medical decision making (see chart for details).      Final Clinical Impressions(s) / UC Diagnoses   Final diagnoses:  Exposure to COVID-19 virus     Discharge Instructions      You must quarantine at home until your test result is available You can check for your test result in  [facility]     ED Prescriptions    None     PDMP not reviewed this encounter.   Raylene Everts, MD 07/01/19 1038

## 2019-07-01 NOTE — ED Triage Notes (Signed)
Pt presents for covid testing after a possible exposure at work; pt states he is unsure if he had direct contact but is not having any symptoms.

## 2019-07-03 LAB — NOVEL CORONAVIRUS, NAA (HOSP ORDER, SEND-OUT TO REF LAB; TAT 18-24 HRS): SARS-CoV-2, NAA: NOT DETECTED

## 2019-07-06 ENCOUNTER — Telehealth: Payer: Self-pay | Admitting: Podiatry

## 2019-07-06 ENCOUNTER — Telehealth: Payer: Self-pay | Admitting: *Deleted

## 2019-07-06 NOTE — Telephone Encounter (Signed)
DOS: 07/08/2019  SURGICAL PROCEDURES: Metatarsal Osteotomy 4th Right and 3rd [phone])  BCBS Policy Effective : 06/17/2016  -  06/16/9998  Member Liability Summary     In-Network   Max Per Benefit Period Year-to-Date Remaining     CoInsurance         Deductible $600.00  $600.00     Out-Of-Pocket 3 $1,800.00 $1,800.00  3 Out-of-Pocket includes copay, deductible, and coinsurance.  Hospital - Ambulatory Surgical     In-Network Copay Coinsurance  Authorization Required Not Applicable 20%   No

## 2019-07-06 NOTE — Telephone Encounter (Signed)
"  I'm scheduled for surgery on Thursday.  I'm calling to get a little more information.  If you could, give me a call back."

## 2019-07-07 NOTE — Telephone Encounter (Signed)
I attempted to return his call.  I left him a message asking him to call me back.

## 2019-07-08 ENCOUNTER — Encounter: Payer: Self-pay | Admitting: Podiatry

## 2019-07-08 ENCOUNTER — Ambulatory Visit (INDEPENDENT_AMBULATORY_CARE_PROVIDER_SITE_OTHER): Payer: BC Managed Care – PPO | Admitting: Podiatry

## 2019-07-08 ENCOUNTER — Other Ambulatory Visit: Payer: Self-pay | Admitting: Podiatry

## 2019-07-08 ENCOUNTER — Other Ambulatory Visit: Payer: Self-pay

## 2019-07-08 DIAGNOSIS — M779 Enthesopathy, unspecified: Secondary | ICD-10-CM | POA: Diagnosis not present

## 2019-07-08 DIAGNOSIS — M7751 Other enthesopathy of right foot: Secondary | ICD-10-CM | POA: Diagnosis not present

## 2019-07-08 DIAGNOSIS — M21541 Acquired clubfoot, right foot: Secondary | ICD-10-CM | POA: Diagnosis not present

## 2019-07-08 DIAGNOSIS — M21542 Acquired clubfoot, left foot: Secondary | ICD-10-CM | POA: Diagnosis not present

## 2019-07-08 DIAGNOSIS — Z01818 Encounter for other preprocedural examination: Secondary | ICD-10-CM | POA: Diagnosis not present

## 2019-07-08 DIAGNOSIS — R58 Hemorrhage, not elsewhere classified: Secondary | ICD-10-CM

## 2019-07-08 DIAGNOSIS — M25572 Pain in left ankle and joints of left foot: Secondary | ICD-10-CM | POA: Diagnosis not present

## 2019-07-08 DIAGNOSIS — M25571 Pain in right ankle and joints of right foot: Secondary | ICD-10-CM | POA: Diagnosis not present

## 2019-07-08 MED ORDER — OXYCODONE-ACETAMINOPHEN 5-325 MG PO TABS: 1.0000 | ORAL_TABLET | Freq: Four times a day (QID) | ORAL | 0 refills | Status: DC | PRN

## 2019-07-08 NOTE — Progress Notes (Signed)
PRN postop 

## 2019-07-09 NOTE — Progress Notes (Signed)
Subjective: Jared Reynolds is a 44 y.o. is seen today in office s/p left third metatarsal osteotomy and right fourth metatarsal osteotomy performed earlier today with Dr. Logan Bores.  He presents today for bleeding on the bandage on the left foot.  His pain is controlled.  Is been wearing surgical shoes. Denies any systemic complaints such as fevers, chills, nausea, vomiting. No calf pain, chest pain, shortness of breath.   Objective: General: No acute distress, AAOx3  DP/PT pulses palpable 2/4, CRT < 3 sec to all digits.  Protective sensation intact. Motor function intact.  LEFT foot: Incision is well coapted without any evidence of dehiscence.  However there was minimal active bleeding present towards the distal portion incision.  After pressure was held there is no further bleeding.  Mild edema of the foot.  There is no active hematoma that I can identify.   Right foot with dressing clean, dry, intact with any blood on the bandage. No other areas of tenderness to bilateral lower extremities.  No other open lesions or pre-ulcerative lesions.  No pain with calf compression, swelling, warmth, erythema.   Assessment and Plan:  Status post left foot surgery with bleeding  -Treatment options discussed including all alternatives, risks, and complications -To the dressing was removed given the bleeding.  I held compression and the bleeding stopped and a dry sterile dressing was reapplied.  I did this under sterile conditions. -Continue ice elevate, pain medication as needed.  Follow postoperative instructions as directed by Dr. Logan Bores. -Monitor for any clinical signs or symptoms of infection and DVT/PE and directed to call the office immediately should any occur or go to the ER. -Follow-up as scheduled with Dr. Logan Bores or sooner if any problems arise. In the meantime, encouraged to call the office with any questions, concerns, change in symptoms.   Ovid Curd, DPM

## 2019-07-14 ENCOUNTER — Ambulatory Visit (INDEPENDENT_AMBULATORY_CARE_PROVIDER_SITE_OTHER): Payer: BC Managed Care – PPO | Admitting: Podiatry

## 2019-07-14 ENCOUNTER — Ambulatory Visit (INDEPENDENT_AMBULATORY_CARE_PROVIDER_SITE_OTHER): Payer: BC Managed Care – PPO

## 2019-07-14 ENCOUNTER — Other Ambulatory Visit: Payer: Self-pay

## 2019-07-14 DIAGNOSIS — M7751 Other enthesopathy of right foot: Secondary | ICD-10-CM

## 2019-07-14 DIAGNOSIS — M7752 Other enthesopathy of left foot: Secondary | ICD-10-CM | POA: Diagnosis not present

## 2019-07-14 DIAGNOSIS — Z9889 Other specified postprocedural states: Secondary | ICD-10-CM

## 2019-07-14 MED ORDER — OXYCODONE-ACETAMINOPHEN 10-325 MG PO TABS: 1.0000 | ORAL_TABLET | Freq: Four times a day (QID) | ORAL | 0 refills | Status: AC | PRN

## 2019-07-14 MED ORDER — DOXYCYCLINE HYCLATE 100 MG PO TABS: 100.0000 mg | ORAL_TABLET | Freq: Two times a day (BID) | ORAL | 0 refills | Status: DC

## 2019-07-16 ENCOUNTER — Ambulatory Visit (INDEPENDENT_AMBULATORY_CARE_PROVIDER_SITE_OTHER): Payer: BC Managed Care – PPO | Admitting: Podiatry

## 2019-07-16 ENCOUNTER — Other Ambulatory Visit: Payer: Self-pay

## 2019-07-16 ENCOUNTER — Telehealth: Payer: Self-pay | Admitting: Podiatry

## 2019-07-16 DIAGNOSIS — Z9889 Other specified postprocedural states: Secondary | ICD-10-CM

## 2019-07-16 DIAGNOSIS — M7751 Other enthesopathy of right foot: Secondary | ICD-10-CM

## 2019-07-16 DIAGNOSIS — M7752 Other enthesopathy of left foot: Secondary | ICD-10-CM

## 2019-07-16 NOTE — Telephone Encounter (Signed)
error 

## 2019-07-17 NOTE — Progress Notes (Signed)
   Subjective:  Patient presents today status post Weil osteotomy bilateral. DOS: 07/08/2019. He states he is doing well. He reports being seen by Dr. Ardelle Anton on 07/08/2019 for active bleeding of the left foot. He reports continued active bleeding secondary to a ruptured suture. He reports some minor swelling of the right foot. He has been using the post op shoes bilaterally and taking Percocet for pain. Patient is here for further evaluation and treatment.  No past medical history on file.    Objective/Physical Exam Neurovascular status intact.  Skin incisions appear to be well coapted with sutures and staples intact of the right foot. Sutures are broken along the distal incision of the left foot. No sign of infectious process noted. No dehiscence. No active bleeding noted. Moderate edema noted to the surgical extremity.  Radiographic Exam:  Orthopedic hardware and osteotomies sites appear to be stable with routine healing.  Assessment: 1. s/p Weil osteotomy bilateral. DOS: 07/08/2019   Plan of Care:  1. Patient was evaluated. X-rays reviewed 2. Sutures left intact.  3. Dry sterile dressing applied. Keep clean, dry and intact for one week.  4. Prescription for Doxycycline 100 mg provided to patient.  5. Prescription for Percocet 10/325 mg provided to patient.  6. Continue weightbearing in post op shoes bilaterally.  7. Return to clinic in one week.    Felecia Shelling, DPM [facility]  Dr. Felecia Shelling, DPM    [address]                                        [place], [place] [address]                Office [phone]  Fax [phone]

## 2019-07-20 ENCOUNTER — Encounter: Payer: Self-pay | Admitting: Podiatry

## 2019-07-20 NOTE — Progress Notes (Signed)
  Subjective:  Patient ID: Jared Reynolds, male    DOB: 1976/05/06,  MRN: [id number]  Chief Complaint  Patient presents with  . Routine Post Op    01.21.2021 Metatarsal Osteotomy 4th Rt & 3rd Lt, pt states that the left foot has been bleeding, pt shows no signs of infection, pt also states that he did no extrenious activity but not much has helped at all.     44 y.o. male returns for post-op check.  Patient presents with a possible complaint of bleeding through the bandages.  Patient states that the sutures have become loose that may have caused the bleeding.  Patient shows no clinical signs of infection.  He has been wearing his surgical shoe.  He has not done any extraneous activity on the left foot.  He denies any other acute complaints.  He had just noticed some bleeding through the bandage however not active.  Review of Systems: Negative except as noted in the HPI. Denies N/V/F/Ch.  No past medical history on file.  Current Outpatient Medications:  .  diclofenac (VOLTAREN) 75 MG EC tablet, Take 1 tablet (75 mg total) by mouth 2 (two) times daily., Disp: 60 tablet, Rfl: 1 .  doxycycline (VIBRA-TABS) 100 MG tablet, Take 1 tablet (100 mg total) by mouth 2 (two) times daily., Disp: 20 tablet, Rfl: 0 .  terbinafine (LAMISIL) 250 MG tablet, Take 250 mg by mouth daily., Disp: , Rfl:   Social History   Tobacco Use  Smoking Status Light Tobacco Smoker  . Packs/day: 0.50  . Types: Cigars  Smokeless Tobacco Never Used    No Known Allergies Objective:  There were no vitals filed for this visit. There is no height or weight on file to calculate BMI. Constitutional Well developed. Well nourished.  Vascular Foot warm and well perfused. Capillary refill normal to all digits.   Neurologic Normal speech. Oriented to person, place, and time. Epicritic sensation to light touch grossly present bilaterally.  Dermatologic Skin healing well without signs of infection. Skin edges well coapted  without signs of infection.  Orthopedic: Tenderness to palpation noted about the surgical site.   Radiographs: None Assessment:  No diagnosis found. Plan:  Patient was evaluated and treated and all questions answered.  S/p foot surgery left -Progressing as expected post-operatively. -XR: None -WB Status: Weightbearing as tolerated in surgical shoe -Sutures: Sutures intact without any signs of dehiscence.  No active bleeding noted.  Dry surgical margins noted. -Medications: None -Foot redressed with Betadine wet-to-dry dressing.  Patient will be scheduled see Dr. Logan Bores back next week.  No follow-ups on file.

## 2019-07-26 ENCOUNTER — Ambulatory Visit (INDEPENDENT_AMBULATORY_CARE_PROVIDER_SITE_OTHER): Payer: BC Managed Care – PPO

## 2019-07-26 ENCOUNTER — Other Ambulatory Visit: Payer: Self-pay

## 2019-07-26 ENCOUNTER — Ambulatory Visit (INDEPENDENT_AMBULATORY_CARE_PROVIDER_SITE_OTHER): Payer: BC Managed Care – PPO | Admitting: Podiatry

## 2019-07-26 ENCOUNTER — Ambulatory Visit: Payer: BC Managed Care – PPO

## 2019-07-26 VITALS — Temp 97.4°F

## 2019-07-26 DIAGNOSIS — Z9889 Other specified postprocedural states: Secondary | ICD-10-CM

## 2019-07-26 DIAGNOSIS — M7752 Other enthesopathy of left foot: Secondary | ICD-10-CM

## 2019-07-26 DIAGNOSIS — M7751 Other enthesopathy of right foot: Secondary | ICD-10-CM

## 2019-07-26 MED ORDER — OXYCODONE-ACETAMINOPHEN 5-325 MG PO TABS: 1.0000 | ORAL_TABLET | Freq: Four times a day (QID) | ORAL | 0 refills | Status: DC | PRN

## 2019-07-27 NOTE — Progress Notes (Signed)
   Subjective:  Patient presents today status post Weil osteotomy bilateral. DOS: 07/08/2019. He states he is improving and doing better everyday. He has been taking Percocet for pain which helps alleviate it. He denies any worsening factors at this time. He has been using the post op shoes as directed. Patient is here for further evaluation and treatment.  No past medical history on file.    Objective/Physical Exam Neurovascular status intact.  Skin incisions appear to be well coapted with sutures and staples intact of the right foot. No sign of infectious process noted. No dehiscence. No active bleeding noted. Moderate edema noted to the surgical extremity.  Radiographic Exam:  Orthopedic hardware and osteotomies sites appear to be stable with routine healing.  Assessment: 1. s/p Weil osteotomy bilateral. DOS: 07/08/2019   Plan of Care:  1. Patient was evaluated. X-rays reviewed 2. Sutures removed.  3. Continue using post op shoes for two weeks.  4. Return to clinic in 4 weeks.    Felecia Shelling, DPM [facility]  Dr. Felecia Shelling, DPM    [address]                                        [place], [place] [address]                Office [phone]  Fax [phone]

## 2019-07-29 ENCOUNTER — Other Ambulatory Visit: Payer: Self-pay | Admitting: Podiatry

## 2019-07-29 DIAGNOSIS — M7751 Other enthesopathy of right foot: Secondary | ICD-10-CM

## 2019-08-09 ENCOUNTER — Encounter: Payer: BC Managed Care – PPO | Admitting: Podiatry

## 2019-08-23 ENCOUNTER — Ambulatory Visit (INDEPENDENT_AMBULATORY_CARE_PROVIDER_SITE_OTHER): Payer: BC Managed Care – PPO | Admitting: Podiatry

## 2019-08-23 ENCOUNTER — Other Ambulatory Visit: Payer: Self-pay

## 2019-08-23 DIAGNOSIS — Z9889 Other specified postprocedural states: Secondary | ICD-10-CM

## 2019-08-23 DIAGNOSIS — M7752 Other enthesopathy of left foot: Secondary | ICD-10-CM

## 2019-08-23 DIAGNOSIS — M7751 Other enthesopathy of right foot: Secondary | ICD-10-CM

## 2019-08-24 ENCOUNTER — Other Ambulatory Visit: Payer: Self-pay | Admitting: Podiatry

## 2019-08-26 NOTE — Progress Notes (Addendum)
   Subjective:  Patient presents today status post Weil osteotomy bilateral. DOS: 07/08/2019. He states he is doing well overall. He reports some intermittent tightness of the foot. He reports difficulty with range of motion of the toes. He has been wearing regular shoes and states they help alleviate his symptoms. Patient is here for further evaluation and treatment.   No past medical history on file.    Objective/Physical Exam Neurovascular status intact.  Skin incisions appear to be well coapted. No sign of infectious process noted. No dehiscence. No active bleeding noted. Moderate edema noted to the surgical extremity.  Assessment: 1. s/p Weil osteotomy bilateral. DOS: 07/08/2019   Plan of Care:  1. Patient was evaluated.  2. May resume full activity with no restrictions beginning on 10/06/2019.  3. Recommended daily ROM exercises.  4. Recommended good shoe gear.  5. Return to clinic as needed.    Felecia Shelling, DPM [facility]  Dr. Felecia Shelling, DPM    [address]                                        [place], [place] [address]                Office [phone]  Fax [phone]

## 2019-08-30 ENCOUNTER — Telehealth: Payer: Self-pay | Admitting: Podiatry

## 2019-08-30 NOTE — Telephone Encounter (Signed)
Patient called requesting a call back from Dr. Logan Bores.

## 2019-08-30 NOTE — Telephone Encounter (Signed)
No answer. Went to [employer]. Left message. Will try again tomorrow. - Dr. Logan Bores

## 2019-08-31 NOTE — Telephone Encounter (Signed)
Spoke with patient. - Dr.Wilhelmine Krogstad

## 2019-09-01 ENCOUNTER — Telehealth: Payer: Self-pay | Admitting: Podiatry

## 2019-09-01 NOTE — Telephone Encounter (Signed)
I spoke with Jared Reynolds and he said, that you extended his leave until 10/06/19 approx.  On the last not, you have him going back on 09/09/2019, with no restrictions. If you want the RTW extended, could you please amend that last note. Thank you

## 2019-09-01 NOTE — Telephone Encounter (Signed)
Done. Thanks, Dr. Tien Aispuro

## 2019-10-04 ENCOUNTER — Encounter: Payer: Self-pay | Admitting: Podiatry

## 2019-10-04 ENCOUNTER — Ambulatory Visit: Payer: BC Managed Care – PPO | Admitting: Podiatry

## 2019-10-04 ENCOUNTER — Other Ambulatory Visit: Payer: Self-pay

## 2019-10-04 ENCOUNTER — Ambulatory Visit (INDEPENDENT_AMBULATORY_CARE_PROVIDER_SITE_OTHER): Payer: BC Managed Care – PPO

## 2019-10-04 DIAGNOSIS — M7752 Other enthesopathy of left foot: Secondary | ICD-10-CM

## 2019-10-04 DIAGNOSIS — L989 Disorder of the skin and subcutaneous tissue, unspecified: Secondary | ICD-10-CM

## 2019-10-04 DIAGNOSIS — Z9889 Other specified postprocedural states: Secondary | ICD-10-CM

## 2019-10-04 DIAGNOSIS — N529 Male erectile dysfunction, unspecified: Secondary | ICD-10-CM | POA: Diagnosis not present

## 2019-10-04 DIAGNOSIS — M25511 Pain in right shoulder: Secondary | ICD-10-CM | POA: Diagnosis not present

## 2019-10-04 DIAGNOSIS — F17200 Nicotine dependence, unspecified, uncomplicated: Secondary | ICD-10-CM | POA: Diagnosis not present

## 2019-10-04 DIAGNOSIS — M7751 Other enthesopathy of right foot: Secondary | ICD-10-CM

## 2019-10-04 NOTE — Patient Instructions (Signed)
Over-the-counter corn and callus remover at the pharmacy.

## 2019-10-06 NOTE — Progress Notes (Signed)
   Subjective:  Patient presents today status post Weil osteotomy bilateral. DOS: 07/08/2019. He states he is doing well but states he has a callus that is reappearing on the left foot. Walking and bearing weight cause pain in the area. He has not done anything for treatment at home. Patient is here for further evaluation and treatment.   No past medical history on file.    Objective/Physical Exam Neurovascular status intact.  Skin incisions appear to be well coapted. No sign of infectious process noted. No dehiscence. No active bleeding noted. Moderate edema noted to the surgical extremity. Hyperkeratotic lesion(s) present on the bilateral feet, left worse than right. Pain on palpation with a central nucleated core noted.   Radiographic Exam:  Orthopedic hardware and osteotomies sites appear to be stable with routine healing.  Assessment: 1. s/p Weil osteotomy bilateral. DOS: 07/08/2019 2. Porokeratosis bilateral feet, left worse than right   Plan of Care:  1. Patient was evaluated. X-Rays reviewed.  2. Excisional debridement of keratotic lesion(s) using a chisel blade was performed without incident. Salinocaine applied and light dressing placed.  3. Recommended OTC corn and callus remover.  4. Patient returning to work on 10/06/2019 full activity with no restrictions.  5. Return to clinic in 2 months.    Felecia Shelling, DPM [facility]  Dr. Felecia Shelling, DPM    [address]                                        [place], [place] [address]                Office [phone]  Fax [phone]

## 2019-10-07 DIAGNOSIS — M25511 Pain in right shoulder: Secondary | ICD-10-CM | POA: Diagnosis not present

## 2019-12-06 ENCOUNTER — Ambulatory Visit: Payer: BC Managed Care – PPO | Admitting: Podiatry

## 2019-12-22 ENCOUNTER — Other Ambulatory Visit: Payer: Self-pay

## 2019-12-22 ENCOUNTER — Ambulatory Visit: Payer: BC Managed Care – PPO | Admitting: Podiatry

## 2019-12-22 VITALS — Temp 96.8°F

## 2019-12-22 DIAGNOSIS — L989 Disorder of the skin and subcutaneous tissue, unspecified: Secondary | ICD-10-CM

## 2019-12-22 DIAGNOSIS — Z9889 Other specified postprocedural states: Secondary | ICD-10-CM | POA: Diagnosis not present

## 2019-12-22 DIAGNOSIS — M79672 Pain in left foot: Secondary | ICD-10-CM | POA: Diagnosis not present

## 2019-12-22 DIAGNOSIS — M79671 Pain in right foot: Secondary | ICD-10-CM | POA: Diagnosis not present

## 2019-12-22 NOTE — Progress Notes (Signed)
   Subjective:  Patient presents today status post Weil osteotomy bilateral. DOS: 07/08/2019.  Patient states that since he has been back to full activity the calluses have returned and are significantly painful again.  He has seen multiple doctors in the past and unfortunately the Weil osteotomies to offload the pressure from the calluses did not seem to improve his symptoms. He is frustrated that he continues to have pain.  He feels that there are no other options to alleviate his symptoms.  No past medical history on file.    Objective: Physical Exam General: The patient is alert and oriented x3 in no acute distress.  Dermatology: Skin is cool, dry and supple bilateral lower extremities. Negative for open lesions or macerations.  Painful callus lesions are noted to the plantar bilateral forefoot with a central nucleated core consistent with a deep porokeratosis bilateral.  Skin incisions to the dorsum of the foot have healed completely.  Vascular: Palpable pedal pulses bilaterally. No edema or erythema noted. Capillary refill within normal limits.  Neurological: Epicritic and protective threshold grossly intact bilaterally.   Musculoskeletal Exam: All pedal and ankle joints range of motion within normal limits bilateral. Muscle strength 5/5 in all groups bilateral.  Significant pain on palpation to the bilateral porokeratotic lesions noted  Assessment: 1. s/p Weil osteotomy bilateral. DOS: 07/08/2019 2. Porokeratosis bilateral feet, left worse than right   Plan of Care:  1. Patient was evaluated.   2. Excisional debridement of keratotic lesion(s) using a chisel blade was performed without incident.  Cantharone applied and light dressing placed.  3.  Patient has this week off of work.  Return to clinic in 2 weeks for follow-up  Felecia Shelling, DPM [facility]  Dr. Felecia Shelling, DPM    [address]                                        [place], [place] [address]                 Office [phone]  Fax [phone]

## 2020-01-03 ENCOUNTER — Other Ambulatory Visit: Payer: Self-pay

## 2020-01-03 ENCOUNTER — Ambulatory Visit: Payer: BC Managed Care – PPO | Admitting: Podiatry

## 2020-01-03 DIAGNOSIS — M7752 Other enthesopathy of left foot: Secondary | ICD-10-CM

## 2020-01-03 DIAGNOSIS — L989 Disorder of the skin and subcutaneous tissue, unspecified: Secondary | ICD-10-CM

## 2020-01-03 DIAGNOSIS — M7751 Other enthesopathy of right foot: Secondary | ICD-10-CM

## 2020-01-03 DIAGNOSIS — Z9889 Other specified postprocedural states: Secondary | ICD-10-CM

## 2020-01-04 NOTE — Progress Notes (Signed)
   Subjective:  Patient presents today status post Weil osteotomy bilateral. DOS: 07/08/2019.  Last visit on 12/22/2019 the symptomatic calluses were debrided with Cantharone that was applied.  He presents for follow-up treatment and evaluation.  No new complaints at this time.  No past medical history on file.    Objective: Physical Exam General: The patient is alert and oriented x3 in no acute distress.  Dermatology: Skin is cool, dry and supple bilateral lower extremities. Negative for open lesions or macerations.  Painful callus lesions are noted to the plantar bilateral forefoot with a central nucleated core consistent with a deep porokeratosis bilateral.  Skin incisions to the dorsum of the foot have healed completely.  Vascular: Palpable pedal pulses bilaterally. No edema or erythema noted. Capillary refill within normal limits.  Neurological: Epicritic and protective threshold grossly intact bilaterally.   Musculoskeletal Exam: All pedal and ankle joints range of motion within normal limits bilateral. Muscle strength 5/5 in all groups bilateral.  Significant pain on palpation to the bilateral porokeratotic lesions noted  Assessment: 1. s/p Weil osteotomy bilateral. DOS: 07/08/2019 2. Porokeratosis bilateral feet, left worse than right   Plan of Care:  1. Patient was evaluated.   2. Excisional debridement of keratotic lesion(s) using a chisel blade was performed without incident.  Recommend OTC corn and callus remover 3.  Return to clinic in 6 weeks  Felecia Shelling, DPM [facility]  Dr. Felecia Shelling, DPM    [address]                                        [place], [place] [address]                Office [phone]  Fax [phone]

## 2020-01-31 DIAGNOSIS — R350 Frequency of micturition: Secondary | ICD-10-CM | POA: Diagnosis not present

## 2020-01-31 DIAGNOSIS — Z23 Encounter for immunization: Secondary | ICD-10-CM | POA: Diagnosis not present

## 2020-01-31 DIAGNOSIS — E663 Overweight: Secondary | ICD-10-CM | POA: Diagnosis not present

## 2020-01-31 DIAGNOSIS — F17200 Nicotine dependence, unspecified, uncomplicated: Secondary | ICD-10-CM | POA: Diagnosis not present

## 2020-01-31 DIAGNOSIS — Z0001 Encounter for general adult medical examination with abnormal findings: Secondary | ICD-10-CM | POA: Diagnosis not present

## 2020-01-31 DIAGNOSIS — Z131 Encounter for screening for diabetes mellitus: Secondary | ICD-10-CM | POA: Diagnosis not present

## 2020-01-31 DIAGNOSIS — Z136 Encounter for screening for cardiovascular disorders: Secondary | ICD-10-CM | POA: Diagnosis not present

## 2020-02-14 ENCOUNTER — Ambulatory Visit: Payer: BC Managed Care – PPO | Admitting: Podiatry

## 2020-02-28 ENCOUNTER — Ambulatory Visit: Payer: BC Managed Care – PPO | Admitting: Podiatry

## 2020-02-28 ENCOUNTER — Other Ambulatory Visit: Payer: Self-pay

## 2020-02-28 DIAGNOSIS — L989 Disorder of the skin and subcutaneous tissue, unspecified: Secondary | ICD-10-CM | POA: Diagnosis not present

## 2020-02-28 DIAGNOSIS — Z9889 Other specified postprocedural states: Secondary | ICD-10-CM

## 2020-02-28 NOTE — Progress Notes (Signed)
   Subjective:  Patient presents today status post Weil osteotomy bilateral. DOS: 07/08/2019.  Patient states that the Cantharone helped significantly however he does not want to apply it again today due to the pain that caused at the time of application.Jared Reynolds  He presents for follow-up treatment and evaluation.  No new complaints at this time.  No past medical history on file.    Objective: Physical Exam General: The patient is alert and oriented x3 in no acute distress.  Dermatology: Skin is cool, dry and supple bilateral lower extremities. Negative for open lesions or macerations.  Painful callus lesions are noted to the plantar bilateral forefoot with a central nucleated core consistent with a deep porokeratosis bilateral.  Skin incisions to the dorsum of the foot have healed completely. Hyperkeratotic dystrophic discolored nails noted bilateral  Vascular: Palpable pedal pulses bilaterally. No edema or erythema noted. Capillary refill within normal limits.  Neurological: Epicritic and protective threshold grossly intact bilaterally.   Musculoskeletal Exam: All pedal and ankle joints range of motion within normal limits bilateral. Muscle strength 5/5 in all groups bilateral.  Significant pain on palpation to the bilateral porokeratotic lesions noted  Assessment: 1. s/p Weil osteotomy bilateral. DOS: 07/08/2019 2. Porokeratosis bilateral feet, left worse than right  3. Onychomcyosis of toenails bilatearl  Plan of Care:  1. Patient was evaluated.   2. Excisional debridement of keratotic lesion(s) using a chisel blade was performed without incident.  Salinocaine topical salicylic acid provided for the patient 3.  Rx Lamisil 250mg  #90 daily 4. Return to clinic in 2 months  , DPM [facility]  Dr. Felecia Shelling, DPM    [address]                                        [place], [place]                Office [phone]  Fax [phone]

## 2020-03-08 ENCOUNTER — Ambulatory Visit (HOSPITAL_COMMUNITY)
Admission: EM | Admit: 2020-03-08 | Discharge: 2020-03-08 | Disposition: A | Discharge status: Home / Self Care | Payer: BC Managed Care – PPO | Attending: Family Medicine | Admitting: Family Medicine

## 2020-03-08 ENCOUNTER — Other Ambulatory Visit: Payer: Self-pay

## 2020-03-08 ENCOUNTER — Encounter (HOSPITAL_COMMUNITY): Payer: Self-pay | Admitting: *Deleted

## 2020-03-08 DIAGNOSIS — J069 Acute upper respiratory infection, unspecified: Secondary | ICD-10-CM | POA: Insufficient documentation

## 2020-03-08 DIAGNOSIS — F17210 Nicotine dependence, cigarettes, uncomplicated: Secondary | ICD-10-CM | POA: Diagnosis not present

## 2020-03-08 DIAGNOSIS — Z20822 Contact with and (suspected) exposure to covid-19: Secondary | ICD-10-CM | POA: Diagnosis not present

## 2020-03-08 DIAGNOSIS — R0602 Shortness of breath: Secondary | ICD-10-CM | POA: Diagnosis not present

## 2020-03-08 DIAGNOSIS — Z79899 Other long term (current) drug therapy: Secondary | ICD-10-CM | POA: Diagnosis not present

## 2020-03-08 DIAGNOSIS — R05 Cough: Secondary | ICD-10-CM | POA: Insufficient documentation

## 2020-03-08 DIAGNOSIS — L0291 Cutaneous abscess, unspecified: Secondary | ICD-10-CM | POA: Insufficient documentation

## 2020-03-08 LAB — SARS CORONAVIRUS 2 (TAT 6-24 HRS): SARS Coronavirus 2: NEGATIVE

## 2020-03-08 MED ORDER — SULFAMETHOXAZOLE-TRIMETHOPRIM 800-160 MG PO TABS: 1.0000 | ORAL_TABLET | Freq: Two times a day (BID) | ORAL | 0 refills | Status: AC

## 2020-03-08 NOTE — ED Triage Notes (Signed)
Patient also wants to be seen for bump on right upper back. White/clear drainage noted from bump on the right upper back. Patient states that the bump has been on his back for about 6-7 months.

## 2020-03-08 NOTE — ED Provider Notes (Signed)
[facility]    CSN: [id number] Arrival date & time: 03/08/20  8588      History   Chief Complaint Chief Complaint  Patient presents with  . Nasal Congestion  . Shortness of Breath    HPI Jared Reynolds is a 44 y.o. male.   Here today with about 2 days of sinus drainage, headache, and mild cough. Denies abdominal pain, N/V/D, wheezing, SOB, CP. Other members of household sick and one tested positive for COVID. So far taking some extra vitamins at home and taking some OTC decongestants.   Also notes a bump on his right upper back now starting to drain the past 2 days. Has been mildly tender and larger than usual as well. Denies known injury to area and not applying anything topically. Denies fever, chills, sweats, body aches.       History reviewed. No pertinent past medical history.  There are no problems to display for this patient.   Past Surgical History:  Procedure Laterality Date  . FINGER AMPUTATION     left hand index finger 2000  . WISDOM TOOTH EXTRACTION         Home Medications    Prior to Admission medications   Medication Sig Start Date End Date Taking? Authorizing Provider  diclofenac (VOLTAREN) 75 MG EC tablet TAKE 1 TABLET(75 MG) BY MOUTH TWICE DAILY 08/24/19   Felecia Shelling, DPM  oxyCODONE-acetaminophen (PERCOCET) 5-325 MG tablet Take 1 tablet by mouth every 6 (six) hours as needed for severe pain. Patient not taking: Reported on 12/22/2019 07/26/19   Felecia Shelling, DPM  sildenafil (VIAGRA) 100 MG tablet Take 100 mg by mouth daily. 02/01/20   [provider]  sulfamethoxazole-trimethoprim (BACTRIM DS) 800-160 MG tablet Take 1 tablet by mouth 2 (two) times daily for 7 days. 03/08/20 03/15/20  Particia Nearing, PA-C  terbinafine (LAMISIL) 250 MG tablet Take 250 mg by mouth daily. 04/28/19   [provider]  triamcinolone (NASACORT) 55 MCG/ACT nasal inhaler Place 2 sprays into the nose daily. 10/07/11 07/01/19  Elpidio Anis, PA-C    Family History Family History  Problem Relation Age of Onset  . Hypertension Mother     Social History Social History   Tobacco Use  . Smoking status: Light Tobacco Smoker    Packs/day: 0.50    Types: Cigars  . Smokeless tobacco: Never Used  Substance Use Topics  . Alcohol use: Yes    Comment: 1 beer/ day  . Drug use: Yes    Types: Marijuana    Comment: occasional     Allergies   Patient has no known allergies.   Review of Systems Review of Systems PER HPI   Physical Exam Triage Vital Signs ED Triage Vitals  Enc Vitals Group     BP 03/08/20 0943 129/84     Pulse Rate 03/08/20 0943 72     Resp 03/08/20 0943 18     Temp 03/08/20 0943 98.6 F (37 C)     Temp Source 03/08/20 0943 Oral     SpO2 03/08/20 0943 100 %     Weight --      Height --      Head Circumference --      Peak Flow --      Pain Score 03/08/20 0945 0     Pain Loc --      Pain Edu? --      Excl. in GC? --    No data found.  Updated Vital Signs BP 129/84 (BP Location: Left Arm)   Pulse 72   Temp 98.6 F (37 C) (Oral)   Resp 18   SpO2 100%   Visual Acuity Right Eye Distance:   Left Eye Distance:   Bilateral Distance:    Right Eye Near:   Left Eye Near:    Bilateral Near:     Physical Exam Vitals and nursing note reviewed.  Constitutional:      Appearance: Normal appearance.  HENT:     Head: Atraumatic.     Right Ear: Tympanic membrane normal.     Left Ear: Tympanic membrane normal.     Nose: Nose normal.     Mouth/Throat:     Mouth: Mucous membranes are moist.     Pharynx: Oropharynx is clear.  Eyes:     Extraocular Movements: Extraocular movements intact.     Conjunctiva/sclera: Conjunctivae normal.  Cardiovascular:     Rate and Rhythm: Normal rate and regular rhythm.  Pulmonary:     Effort: Pulmonary effort is normal.     Breath sounds: Normal breath sounds.  Abdominal:     General: Bowel sounds are normal. There is no distension.      Palpations: Abdomen is soft.     Tenderness: There is no abdominal tenderness. There is no guarding.  Musculoskeletal:        General: Normal range of motion.     Cervical back: Normal range of motion and neck supple.  Skin:    General: Skin is warm.     Comments: 2 cm diameter infected sebaceous cyst, natural opening formed and draining thin purulent fluid with minimal pressure application. Mildly ttp   Neurological:     General: No focal deficit present.     Mental Status: He is oriented to person, place, and time.  Psychiatric:        Mood and Affect: Mood normal.        Thought Content: Thought content normal.        Judgment: Judgment normal.    UC Treatments / Results  Labs (all labs ordered are listed, but only abnormal results are displayed) Labs Reviewed  SARS CORONAVIRUS 2 (TAT 6-24 HRS)    EKG   Radiology No results found.  Procedures Procedures (including critical care time)  Medications Ordered in UC Medications - No data to display  Initial Impression / Assessment and Plan / UC Course  I have reviewed the triage vital signs and the nursing notes.  Pertinent labs & imaging results that were available during my care of the patient were reviewed by me and considered in my medical decision making (see chart for details).     Suspicious for COVID 19 infection given close home contact positive. Continue OTC decongestants, pain relievers, and other symptomatic remedies/supportive care. Return precautions and isolation protocol reviewed, COVID pcr pending, work note given.  Bactrim sent for infected and draining abscess to upper back. Warm compresses, topical antibiotic creams. F/u with PCP if recurring.   Final Clinical Impressions(s) / UC Diagnoses   Final diagnoses:  Viral URI with cough  Abscess   Discharge Instructions   None    ED Prescriptions    Medication Sig Dispense Auth. Provider   sulfamethoxazole-trimethoprim (BACTRIM DS) 800-160 MG  tablet Take 1 tablet by mouth 2 (two) times daily for 7 days. 14 tablet Particia Nearing, [place]     PDMP not reviewed this encounter.   Particia Nearing, [place] 03/08/20 1030

## 2020-03-08 NOTE — ED Triage Notes (Signed)
Patient in with nasal congestion and SOB that started on yesterday. Patient denies any fever, cough or sore throat. Patient has not taken any OTC medications. Patient states that his son's at home COVID testing was positive on yesterday.

## 2020-03-11 ENCOUNTER — Telehealth (HOSPITAL_COMMUNITY): Payer: Self-pay | Admitting: Urgent Care

## 2020-03-11 MED ORDER — BENZONATATE 100 MG PO CAPS: 100.0000 mg | ORAL_CAPSULE | Freq: Three times a day (TID) | ORAL | 0 refills | Status: DC | PRN

## 2020-03-11 NOTE — Telephone Encounter (Signed)
Patient wanted medications for supportive care. Recommended APAP, fluids, rest. Sent rx for Tessalon.

## 2020-03-12 ENCOUNTER — Other Ambulatory Visit: Payer: Self-pay

## 2020-03-12 ENCOUNTER — Ambulatory Visit (HOSPITAL_COMMUNITY)
Admission: EM | Admit: 2020-03-12 | Discharge: 2020-03-12 | Disposition: A | Discharge status: Home / Self Care | Payer: BC Managed Care – PPO | Attending: Family Medicine | Admitting: Family Medicine

## 2020-03-12 ENCOUNTER — Encounter (HOSPITAL_COMMUNITY): Payer: Self-pay

## 2020-03-12 DIAGNOSIS — Z20822 Contact with and (suspected) exposure to covid-19: Secondary | ICD-10-CM | POA: Diagnosis not present

## 2020-03-12 DIAGNOSIS — B349 Viral infection, unspecified: Secondary | ICD-10-CM | POA: Diagnosis not present

## 2020-03-12 NOTE — ED Triage Notes (Signed)
Pt presents with chills and body aches x 1 day. States he woke up this morning and everything "smells different". Pt reports 1 of his son and his wife tested positive fir COVID this week.

## 2020-03-12 NOTE — ED Provider Notes (Signed)
[facility]    CSN: [id number] Arrival date & time: 03/12/20  1507      History   Chief Complaint Chief Complaint  Patient presents with  . Chills  . Generalized Body Aches    HPI Jared Reynolds is a 44 y.o. male.   Started yesterday with rhinorrhea, chills, sweats, change of smell. Was initially seen here at UC 4 days ago because his son had tested positive for COVID and he was having some generalized malaise and body aches, cough, sore throat. Was tested on 22nd which was neg, then took home test Fiday which was neg, then test this morning at home was positive. Not taking anything OTC for sxs at this time. Denies fever, chills, cough, CP, SOB, N/V/D.      History reviewed. No pertinent past medical history.  There are no problems to display for this patient.   Past Surgical History:  Procedure Laterality Date  . FINGER AMPUTATION     left hand index finger 2000  . WISDOM TOOTH EXTRACTION         Home Medications    Prior to Admission medications   Medication Sig Start Date End Date Taking? Authorizing Provider  benzonatate (TESSALON) 100 MG capsule Take 1-2 capsules (100-200 mg total) by mouth 3 (three) times daily as needed. 03/11/20   Wallis Bamberg, PA-C  diclofenac (VOLTAREN) 75 MG EC tablet TAKE 1 TABLET(75 MG) BY MOUTH TWICE DAILY 08/24/19   Felecia Shelling, DPM  oxyCODONE-acetaminophen (PERCOCET) 5-325 MG tablet Take 1 tablet by mouth every 6 (six) hours as needed for severe pain. Patient not taking: Reported on 12/22/2019 07/26/19   Felecia Shelling, DPM  sildenafil (VIAGRA) 100 MG tablet Take 100 mg by mouth daily. 02/01/20   [provider]  sulfamethoxazole-trimethoprim (BACTRIM DS) 800-160 MG tablet Take 1 tablet by mouth 2 (two) times daily for 7 days. 03/08/20 03/15/20  Particia Nearing, PA-C  terbinafine (LAMISIL) 250 MG tablet Take 250 mg by mouth daily. 04/28/19   [provider]  triamcinolone (NASACORT) 55 MCG/ACT nasal  inhaler Place 2 sprays into the nose daily. 10/07/11 07/01/19  Elpidio Anis, PA-C    Family History Family History  Problem Relation Age of Onset  . Hypertension Mother     Social History Social History   Tobacco Use  . Smoking status: Light Tobacco Smoker    Packs/day: 0.50    Types: Cigars  . Smokeless tobacco: Never Used  Substance Use Topics  . Alcohol use: Yes    Comment: 1 beer/ day  . Drug use: Yes    Types: Marijuana    Comment: occasional     Allergies   Patient has no known allergies.   Review of Systems Review of Systems PER HPI    Physical Exam Triage Vital Signs ED Triage Vitals  Enc Vitals Group     BP 03/12/20 1632 116/83     Pulse Rate 03/12/20 1632 78     Resp 03/12/20 1632 20     Temp 03/12/20 1632 98.9 F (37.2 C)     Temp src --      SpO2 03/12/20 1632 100 %     Weight --      Height --      Head Circumference --      Peak Flow --      Pain Score 03/12/20 1631 0     Pain Loc --      Pain Edu? --  Excl. in GC? --    No data found.  Updated Vital Signs BP 116/83 (BP Location: Right Arm)   Pulse 78   Temp 98.9 F (37.2 C)   Resp 20   SpO2 100%   Visual Acuity Right Eye Distance:   Left Eye Distance:   Bilateral Distance:    Right Eye Near:   Left Eye Near:    Bilateral Near:     Physical Exam Vitals and nursing note reviewed.  Constitutional:      Appearance: Normal appearance.  HENT:     Head: Atraumatic.     Right Ear: Tympanic membrane normal.     Left Ear: Tympanic membrane normal.     Nose: Rhinorrhea present.     Mouth/Throat:     Mouth: Mucous membranes are moist.     Pharynx: Oropharynx is clear. Posterior oropharyngeal erythema present.  Eyes:     Extraocular Movements: Extraocular movements intact.     Conjunctiva/sclera: Conjunctivae normal.  Cardiovascular:     Rate and Rhythm: Normal rate and regular rhythm.  Pulmonary:     Effort: Pulmonary effort is normal.     Breath sounds: Normal  breath sounds. No wheezing or rales.  Abdominal:     General: Bowel sounds are normal. There is no distension.     Palpations: Abdomen is soft.     Tenderness: There is no abdominal tenderness. There is no guarding.  Musculoskeletal:        General: Normal range of motion.     Cervical back: Normal range of motion and neck supple.  Skin:    General: Skin is warm and dry.  Neurological:     General: No focal deficit present.     Mental Status: He is oriented to person, place, and time.  Psychiatric:        Mood and Affect: Mood normal.        Thought Content: Thought content normal.        Judgment: Judgment normal.      UC Treatments / Results  Labs (all labs ordered are listed, but only abnormal results are displayed) Labs Reviewed  SARS CORONAVIRUS 2 (TAT 6-24 HRS)    EKG   Radiology No results found.  Procedures Procedures (including critical care time)  Medications Ordered in UC Medications - No data to display  Initial Impression / Assessment and Plan / UC Course  I have reviewed the triage vital signs and the nursing notes.  Pertinent labs & imaging results that were available during my care of the patient were reviewed by me and considered in my medical decision making (see chart for details).     Will restest for COVID given new onset sxs and also positive home COVID test. Continue quarantine, discussed isolation protocol in this situation and new work note given. Discussed OTC symptomatic treatments and home care as well as return precautions.    Final Clinical Impressions(s) / UC Diagnoses   Final diagnoses:  Viral illness  Exposure to COVID-19 virus   Discharge Instructions   None    ED Prescriptions    None     PDMP not reviewed this encounter.   Particia Nearing, [place] 03/12/20 1709

## 2020-03-13 LAB — SARS CORONAVIRUS 2 (TAT 6-24 HRS): SARS Coronavirus 2: POSITIVE — AB

## 2020-03-14 ENCOUNTER — Other Ambulatory Visit: Payer: Self-pay | Admitting: Nurse Practitioner

## 2020-03-14 NOTE — Progress Notes (Signed)
Chart review of  Jared Reynolds for possible COVID-19 MAB infusion treatment. Pt is not qualified for this infusion due to lack of identified risk factors and co-morbid conditions.   There are no problems to display for this patient.   Shawna Clamp, DNP, AGNP-c COVID-19 [facility] [phone]

## 2020-03-21 ENCOUNTER — Other Ambulatory Visit: Payer: Self-pay

## 2020-03-21 ENCOUNTER — Ambulatory Visit (INDEPENDENT_AMBULATORY_CARE_PROVIDER_SITE_OTHER): Payer: BC Managed Care – PPO

## 2020-03-21 ENCOUNTER — Encounter (HOSPITAL_COMMUNITY): Payer: Self-pay | Admitting: *Deleted

## 2020-03-21 ENCOUNTER — Ambulatory Visit (HOSPITAL_COMMUNITY)
Admission: EM | Admit: 2020-03-21 | Discharge: 2020-03-21 | Disposition: A | Discharge status: Home / Self Care | Payer: BC Managed Care – PPO | Attending: Urgent Care | Admitting: Urgent Care

## 2020-03-21 DIAGNOSIS — R5383 Other fatigue: Secondary | ICD-10-CM

## 2020-03-21 DIAGNOSIS — R0602 Shortness of breath: Secondary | ICD-10-CM

## 2020-03-21 DIAGNOSIS — J1282 Pneumonia due to coronavirus disease 2019: Secondary | ICD-10-CM | POA: Diagnosis not present

## 2020-03-21 DIAGNOSIS — R059 Cough, unspecified: Secondary | ICD-10-CM | POA: Diagnosis not present

## 2020-03-21 DIAGNOSIS — J90 Pleural effusion, not elsewhere classified: Secondary | ICD-10-CM | POA: Diagnosis not present

## 2020-03-21 DIAGNOSIS — R062 Wheezing: Secondary | ICD-10-CM | POA: Diagnosis not present

## 2020-03-21 DIAGNOSIS — R6883 Chills (without fever): Secondary | ICD-10-CM

## 2020-03-21 DIAGNOSIS — J129 Viral pneumonia, unspecified: Secondary | ICD-10-CM | POA: Diagnosis not present

## 2020-03-21 DIAGNOSIS — U071 COVID-19: Secondary | ICD-10-CM | POA: Diagnosis not present

## 2020-03-21 DIAGNOSIS — F17200 Nicotine dependence, unspecified, uncomplicated: Secondary | ICD-10-CM

## 2020-03-21 DIAGNOSIS — R918 Other nonspecific abnormal finding of lung field: Secondary | ICD-10-CM | POA: Diagnosis not present

## 2020-03-21 MED ORDER — ALBUTEROL SULFATE HFA 108 (90 BASE) MCG/ACT IN AERS: 1.0000 | INHALATION_SPRAY | Freq: Four times a day (QID) | RESPIRATORY_TRACT | 0 refills | Status: DC | PRN

## 2020-03-21 NOTE — ED Provider Notes (Signed)
[facility]   MRN: [id number] DOB: 1975-07-28  Subjective:   Jared Reynolds is a 44 y.o. male presenting for recheck on his COVID 52. Patient was initially tested on 09/22 and was negative them but retested positive on 09/26. He has completed his 10-day quarantine but is still having significant shortness of breath with minimal exertion. States that he feels fatigued, still has sweats and chills. Has been using supportive care as recommended. He is supposed to go back to work but is very concerned about returning to his normal tests given his significant symptoms. Smokes 3-4 Black & Mild cigars daily. Has not since he had COVID 19.   No current facility-administered medications for this encounter.  Current Outpatient Medications:    benzonatate (TESSALON) 100 MG capsule, Take 1-2 capsules (100-200 mg total) by mouth 3 (three) times daily as needed., Disp: 60 capsule, Rfl: 0   diclofenac (VOLTAREN) 75 MG EC tablet, TAKE 1 TABLET(75 MG) BY MOUTH TWICE DAILY, Disp: 60 tablet, Rfl: 1   oxyCODONE-acetaminophen (PERCOCET) 5-325 MG tablet, Take 1 tablet by mouth every 6 (six) hours as needed for severe pain. (Patient not taking: Reported on 12/22/2019), Disp: 30 tablet, Rfl: 0   sildenafil (VIAGRA) 100 MG tablet, Take 100 mg by mouth daily., Disp: , Rfl:    terbinafine (LAMISIL) 250 MG tablet, Take 250 mg by mouth daily., Disp: , Rfl:    No Known Allergies  History reviewed. No pertinent past medical history.   Past Surgical History:  Procedure Laterality Date   FINGER AMPUTATION     left hand index finger 2000   WISDOM TOOTH EXTRACTION      Family History  Problem Relation Age of Onset   Hypertension Mother     Social History   Tobacco Use   Smoking status: Light Tobacco Smoker    Packs/day: 0.50    Types: Cigars   Smokeless tobacco: Never Used  Substance Use Topics   Alcohol use: Yes    Comment: 1 beer/ day   Drug use: Yes    Types: Marijuana     Comment: occasional    ROS   Objective:   Vitals: BP (!) 132/91 (BP Location: Right Arm)    Pulse 77    Temp 98.4 F (36.9 C) (Oral)    Resp 20    SpO2 96%   Physical Exam Constitutional:      General: He is not in acute distress.    Appearance: Normal appearance. He is well-developed. He is not ill-appearing, toxic-appearing or diaphoretic.  HENT:     Head: Normocephalic and atraumatic.     Right Ear: External ear normal.     Left Ear: External ear normal.     Nose: Nose normal.     Mouth/Throat:     Mouth: Mucous membranes are moist.     Pharynx: Oropharynx is clear.  Eyes:     General: No scleral icterus.       Right eye: No discharge.        Left eye: No discharge.     Extraocular Movements: Extraocular movements intact.     Conjunctiva/sclera: Conjunctivae normal.     Pupils: Pupils are equal, round, and reactive to light.  Cardiovascular:     Rate and Rhythm: Normal rate and regular rhythm.     Heart sounds: Normal heart sounds. No murmur heard.  No friction rub. No gallop.   Pulmonary:     Effort: Pulmonary effort is normal. No  respiratory distress.     Breath sounds: Normal breath sounds. No stridor. No wheezing, rhonchi or rales.  Skin:    General: Skin is warm and dry.  Neurological:     Mental Status: He is alert and oriented to person, place, and time.  Psychiatric:        Mood and Affect: Mood normal.        Behavior: Behavior normal.        Thought Content: Thought content normal.        Judgment: Judgment normal.    DG Chest 2 View  Result Date: 03/21/2020 CLINICAL DATA:  Shortness of breath, wheezing and productive cough. Coronavirus infection. EXAM: CHEST - 2 VIEW COMPARISON:  11/05/2017 FINDINGS: Hazy and patchy bilateral pulmonary infiltrates more extensive in the left lung than the right. No dense consolidation collapse or effusion. Heart size is normal. Mediastinal shadows are normal. IMPRESSION: Hazy and patchy bilateral pulmonary infiltrates  more extensive in the left lung than the right. Findings consistent with viral pneumonia. Electronically Signed   By: Paulina Fusi M.D.   On: 03/21/2020 14:56   Assessment and Plan :   PDMP not reviewed this encounter.  1. Pneumonia due to COVID-19 virus   2. Chills   3. Shortness of breath   4. Malaise and fatigue   5. COVID-19   6. Smoker     Patient did not want any kind of aggressive management.  Declined IM Solu-Medrol for the moment.  Given that he has a viral pneumonia held off on antibiotic course.  Offered an albuterol inhaler given that he tried some of this at home from one family member and had some relief.  He is a smoker, recommended continued efforts at not smoking.  Recheck in 3 to 5 days if symptoms persist.  Follow-up with PCP for short-term disability, FMLA. Counseled patient on potential for adverse effects with medications prescribed/recommended today, ER and return-to-clinic precautions discussed, patient verbalized understanding.    Wallis Bamberg, PA-C 03/21/20 1521

## 2020-03-21 NOTE — ED Triage Notes (Signed)
Patient in with complaints of weakness and chills. Patient states that he starts sweating with movement.Patient tested positive COVID on 03/12/20. Patient states that he experienced wheezing on yesterday. Patient states he does not have a history of asthma or lung disease. Patient states he does not feel like he is well enough to return to work on Thursday.

## 2020-03-22 ENCOUNTER — Other Ambulatory Visit: Payer: Self-pay

## 2020-03-22 ENCOUNTER — Ambulatory Visit
Admission: RE | Admit: 2020-03-22 | Discharge: 2020-03-22 | Disposition: A | Discharge status: Home / Self Care | Payer: BC Managed Care – PPO | Source: Ambulatory Visit | Attending: Emergency Medicine | Admitting: Emergency Medicine

## 2020-03-22 VITALS — BP 132/96 | HR 77 | Temp 98.4°F | Resp 18

## 2020-03-22 DIAGNOSIS — U071 COVID-19: Secondary | ICD-10-CM | POA: Diagnosis not present

## 2020-03-22 DIAGNOSIS — J1282 Pneumonia due to coronavirus disease 2019: Secondary | ICD-10-CM | POA: Diagnosis not present

## 2020-03-22 DIAGNOSIS — R0602 Shortness of breath: Secondary | ICD-10-CM

## 2020-03-22 MED ORDER — PREDNISONE 20 MG PO TABS: 40.0000 mg | ORAL_TABLET | Freq: Every day | ORAL | 0 refills | Status: AC

## 2020-03-22 MED ORDER — METHYLPREDNISOLONE SODIUM SUCC 125 MG IJ SOLR
125.0000 mg | Freq: Once | INTRAMUSCULAR | Status: AC
  Administered 2020-03-22: 125 mg via INTRAMUSCULAR

## 2020-03-22 NOTE — Discharge Instructions (Signed)
We gave you an injection of Solu-Medrol today Begin with prednisone 40 mg daily for the next 5 days-take with food and in the morning if you are able Continue albuterol inhaler as needed for shortness of breath, chest tightness Rest and fluids Follow-up for any persistent or worsening symptoms

## 2020-03-22 NOTE — ED Triage Notes (Signed)
Pt here for cough and SOB since having covid; pt seen at [facility] yesterday and told he has PNA; pt declined steroid injection yesterday but now would like to receive

## 2020-03-22 NOTE — ED Provider Notes (Signed)
[facility]    CSN: [id number] Arrival date & time: 03/22/20  0849      History   Chief Complaint Chief Complaint  Patient presents with   Cough    HPI Jared Reynolds is a 44 y.o. male presenting today for follow-up of cough and pneumonia secondary to Covid.  Patient tested positive for Covid at 9/26.  He has had continued cough and shortness of breath despite completing 10-day quarantine.  Concerned about returning to work, was seen yesterday at urgent care and had chest x-ray showing viral pneumonia, provided albuterol inhaler, initially offered Solu-Medrol injection, but declined this.  HPI  History reviewed. No pertinent past medical history.  There are no problems to display for this patient.   Past Surgical History:  Procedure Laterality Date   FINGER AMPUTATION     left hand index finger 2000   WISDOM TOOTH EXTRACTION         Home Medications    Prior to Admission medications   Medication Sig Start Date End Date Taking? Authorizing Provider  albuterol (VENTOLIN HFA) 108 (90 Base) MCG/ACT inhaler Inhale 1-2 puffs into the lungs every 6 (six) hours as needed for wheezing or shortness of breath. 03/21/20   Wallis Bamberg, PA-C  benzonatate (TESSALON) 100 MG capsule Take 1-2 capsules (100-200 mg total) by mouth 3 (three) times daily as needed. 03/11/20   Wallis Bamberg, PA-C  diclofenac (VOLTAREN) 75 MG EC tablet TAKE 1 TABLET(75 MG) BY MOUTH TWICE DAILY 08/24/19   Felecia Shelling, DPM  oxyCODONE-acetaminophen (PERCOCET) 5-325 MG tablet Take 1 tablet by mouth every 6 (six) hours as needed for severe pain. Patient not taking: Reported on 12/22/2019 07/26/19   Felecia Shelling, DPM  predniSONE (DELTASONE) 20 MG tablet Take 2 tablets (40 mg total) by mouth daily with breakfast for 5 days. 03/22/20 03/27/20  Marialy Urbanczyk C, PA-C  sildenafil (VIAGRA) 100 MG tablet Take 100 mg by mouth daily. 02/01/20   [provider]  terbinafine (LAMISIL) 250 MG tablet Take  250 mg by mouth daily. 04/28/19   [provider]  triamcinolone (NASACORT) 55 MCG/ACT nasal inhaler Place 2 sprays into the nose daily. 10/07/11 07/01/19  Elpidio Anis, PA-C    Family History Family History  Problem Relation Age of Onset   Hypertension Mother     Social History Social History   Tobacco Use   Smoking status: Light Tobacco Smoker    Packs/day: 0.50    Types: Cigars   Smokeless tobacco: Never Used  Substance Use Topics   Alcohol use: Yes    Comment: 1 beer/ day   Drug use: Yes    Types: Marijuana    Comment: occasional     Allergies   Patient has no known allergies.   Review of Systems Review of Systems  Constitutional: Positive for chills and fatigue. Negative for activity change, appetite change and fever.  HENT: Negative for congestion, ear pain, rhinorrhea, sinus pressure, sore throat and trouble swallowing.   Eyes: Negative for discharge and redness.  Respiratory: Positive for cough and shortness of breath. Negative for chest tightness.   Cardiovascular: Negative for chest pain.  Gastrointestinal: Negative for abdominal pain, diarrhea, nausea and vomiting.  Musculoskeletal: Negative for myalgias.  Skin: Negative for rash.  Neurological: Negative for dizziness, light-headedness and headaches.     Physical Exam Triage Vital Signs ED Triage Vitals  Enc Vitals Group     BP      Pulse  Resp      Temp      Temp src      SpO2      Weight      Height      Head Circumference      Peak Flow      Pain Score      Pain Loc      Pain Edu?      Excl. in GC?    No data found.  Updated Vital Signs BP (!) 132/96 (BP Location: Left Arm)    Pulse 77    Temp 98.4 F (36.9 C) (Oral)    Resp 18    SpO2 96%   Visual Acuity Right Eye Distance:   Left Eye Distance:   Bilateral Distance:    Right Eye Near:   Left Eye Near:    Bilateral Near:     Physical Exam Vitals and nursing note reviewed.  Constitutional:       Appearance: He is well-developed.     Comments: No acute distress  HENT:     Head: Normocephalic and atraumatic.     Nose: Nose normal.     Mouth/Throat:     Comments: Oral mucosa pink and moist, no tonsillar enlargement or exudate. Posterior pharynx patent and nonerythematous, no uvula deviation or swelling. Normal phonation. Eyes:     Conjunctiva/sclera: Conjunctivae normal.  Cardiovascular:     Rate and Rhythm: Normal rate.  Pulmonary:     Effort: Pulmonary effort is normal. No respiratory distress.     Comments: Breathing comfortably at rest, CTABL, no wheezing, rales or other adventitious sounds auscultated Abdominal:     General: There is no distension.  Musculoskeletal:        General: Normal range of motion.     Cervical back: Neck supple.  Skin:    General: Skin is warm and dry.  Neurological:     Mental Status: He is alert and oriented to person, place, and time.      UC Treatments / Results  Labs (all labs ordered are listed, but only abnormal results are displayed) Labs Reviewed - No data to display  EKG   Radiology DG Chest 2 View  Result Date: 03/21/2020 CLINICAL DATA:  Shortness of breath, wheezing and productive cough. Coronavirus infection. EXAM: CHEST - 2 VIEW COMPARISON:  11/05/2017 FINDINGS: Hazy and patchy bilateral pulmonary infiltrates more extensive in the left lung than the right. No dense consolidation collapse or effusion. Heart size is normal. Mediastinal shadows are normal. IMPRESSION: Hazy and patchy bilateral pulmonary infiltrates more extensive in the left lung than the right. Findings consistent with viral pneumonia. Electronically Signed   By: Paulina Fusi M.D.   On: 03/21/2020 14:56    Procedures Procedures (including critical care time)  Medications Ordered in UC Medications  methylPREDNISolone sodium succinate (SOLU-MEDROL) 125 mg/2 mL injection 125 mg (125 mg Intramuscular Given 03/22/20 0914)    Initial Impression / Assessment and  Plan / UC Course  I have reviewed the triage vital signs and the nursing notes.  Pertinent labs & imaging results that were available during my care of the patient were reviewed by me and considered in my medical decision making (see chart for details).     Chest x-ray from yesterday suggestive of viral pneumonia, likely secondary to Covid.  Providing Solu-Medrol IM today and continuing on prednisone x5 days.  Continue albuterol inhaler as needed.  Expecting gradual return to normal lung function over the next few weeks.  Gradual increase in activity.  Vital signs stable without hypoxia or tachycardia today.  Discussed strict return precautions. Patient verbalized understanding and is agreeable with plan.  Final Clinical Impressions(s) / UC Diagnoses   Final diagnoses:  Pneumonia due to COVID-19 virus  Shortness of breath     Discharge Instructions     We gave you an injection of Solu-Medrol today Begin with prednisone 40 mg daily for the next 5 days-take with food and in the morning if you are able Continue albuterol inhaler as needed for shortness of breath, chest tightness Rest and fluids Follow-up for any persistent or worsening symptoms    ED Prescriptions    Medication Sig Dispense Auth. Provider   predniSONE (DELTASONE) 20 MG tablet Take 2 tablets (40 mg total) by mouth daily with breakfast for 5 days. 10 tablet Belvie Iribe, Boaz C, PA-C     PDMP not reviewed this encounter.   Lew Dawes, [place] 03/22/20 [phone]

## 2020-03-28 DIAGNOSIS — R0609 Other forms of dyspnea: Secondary | ICD-10-CM | POA: Diagnosis not present

## 2020-03-28 DIAGNOSIS — U071 COVID-19: Secondary | ICD-10-CM | POA: Diagnosis not present

## 2020-03-28 DIAGNOSIS — F17200 Nicotine dependence, unspecified, uncomplicated: Secondary | ICD-10-CM | POA: Diagnosis not present

## 2020-03-28 DIAGNOSIS — N529 Male erectile dysfunction, unspecified: Secondary | ICD-10-CM | POA: Diagnosis not present

## 2020-03-29 ENCOUNTER — Ambulatory Visit: Payer: BC Managed Care – PPO | Admitting: Podiatry

## 2020-03-29 ENCOUNTER — Other Ambulatory Visit: Payer: Self-pay | Admitting: Internal Medicine

## 2020-03-29 DIAGNOSIS — R0609 Other forms of dyspnea: Secondary | ICD-10-CM

## 2020-03-30 ENCOUNTER — Ambulatory Visit
Admission: RE | Admit: 2020-03-30 | Discharge: 2020-03-30 | Disposition: A | Discharge status: Home / Self Care | Payer: BC Managed Care – PPO | Source: Ambulatory Visit | Attending: Internal Medicine | Admitting: Internal Medicine

## 2020-03-30 ENCOUNTER — Other Ambulatory Visit: Payer: Self-pay

## 2020-03-30 DIAGNOSIS — I700 Atherosclerosis of aorta: Secondary | ICD-10-CM | POA: Diagnosis not present

## 2020-03-30 DIAGNOSIS — R0609 Other forms of dyspnea: Secondary | ICD-10-CM

## 2020-03-30 DIAGNOSIS — J984 Other disorders of lung: Secondary | ICD-10-CM | POA: Diagnosis not present

## 2020-03-30 DIAGNOSIS — U071 COVID-19: Secondary | ICD-10-CM | POA: Diagnosis not present

## 2020-03-30 DIAGNOSIS — B999 Unspecified infectious disease: Secondary | ICD-10-CM | POA: Diagnosis not present

## 2020-03-30 MED ORDER — IOPAMIDOL (ISOVUE-370) INJECTION 76%
75.0000 mL | Freq: Once | INTRAVENOUS | Status: AC | PRN
  Administered 2020-03-30: 75 mL via INTRAVENOUS

## 2020-04-10 DIAGNOSIS — R350 Frequency of micturition: Secondary | ICD-10-CM | POA: Diagnosis not present

## 2020-04-10 DIAGNOSIS — F17200 Nicotine dependence, unspecified, uncomplicated: Secondary | ICD-10-CM | POA: Diagnosis not present

## 2020-04-10 DIAGNOSIS — R160 Hepatomegaly, not elsewhere classified: Secondary | ICD-10-CM | POA: Diagnosis not present

## 2020-04-10 DIAGNOSIS — R0609 Other forms of dyspnea: Secondary | ICD-10-CM | POA: Diagnosis not present

## 2020-05-24 ENCOUNTER — Other Ambulatory Visit: Payer: Self-pay

## 2020-05-24 ENCOUNTER — Ambulatory Visit: Payer: BC Managed Care – PPO | Admitting: Podiatry

## 2020-05-24 DIAGNOSIS — L989 Disorder of the skin and subcutaneous tissue, unspecified: Secondary | ICD-10-CM | POA: Diagnosis not present

## 2020-05-24 MED ORDER — TERBINAFINE HCL 250 MG PO TABS
250.0000 mg | ORAL_TABLET | Freq: Every day | ORAL | 0 refills | Status: DC
Start: 2020-05-24 — End: 2022-12-02

## 2020-05-24 NOTE — Progress Notes (Signed)
   Subjective:  Patient presents today for follow-up evaluation of symptomatic calluses to the bilateral forefoot. He states that if he puts the salicylic acid on the forefoot it improves and it feels better. He is requesting to have them trimmed today. He also states that he continues to have some improvement of the nail plates. He is requesting a refill of the oral Lamisil.  No past medical history on file.    Objective: Physical Exam General: The patient is alert and oriented x3 in no acute distress.  Dermatology: Skin is cool, dry and supple bilateral lower extremities. Negative for open lesions or macerations.  Painful callus lesions are noted to the plantar bilateral forefoot with a central nucleated core consistent with a deep porokeratosis bilateral.  Skin incisions to the dorsum of the foot have healed completely. Hyperkeratotic dystrophic discolored nails noted bilateral  Vascular: Palpable pedal pulses bilaterally. No edema or erythema noted. Capillary refill within normal limits.  Neurological: Epicritic and protective threshold grossly intact bilaterally.   Musculoskeletal Exam: All pedal and ankle joints range of motion within normal limits bilateral. Muscle strength 5/5 in all groups bilateral.  Significant pain on palpation to the bilateral porokeratotic lesions noted  Assessment: 1. s/p Weil osteotomy bilateral. DOS: 07/08/2019 2. Porokeratosis bilateral feet, left worse than right  3. Onychomcyosis of toenails bilatearl  Plan of Care:  1. Patient was evaluated.   2. Excisional debridement of keratotic lesion(s) using a chisel blade was performed without incident.  Salinocaine topical salicylic acid provided for the patient 3.  Rx Lamisil 250mg  #90 daily 4. Return to clinic as needed , DPM [facility]  Dr. Felecia Shelling, DPM    [address]                                        [place], [place]                Office [phone]  Fax [phone]

## 2020-09-28 ENCOUNTER — Other Ambulatory Visit: Payer: Self-pay | Admitting: Internal Medicine

## 2020-10-10 DIAGNOSIS — Z13220 Encounter for screening for lipoid disorders: Secondary | ICD-10-CM | POA: Diagnosis not present

## 2020-10-10 DIAGNOSIS — H5711 Ocular pain, right eye: Secondary | ICD-10-CM | POA: Diagnosis not present

## 2020-10-10 DIAGNOSIS — Z7689 Persons encountering health services in other specified circumstances: Secondary | ICD-10-CM | POA: Diagnosis not present

## 2020-10-10 DIAGNOSIS — Z1329 Encounter for screening for other suspected endocrine disorder: Secondary | ICD-10-CM | POA: Diagnosis not present

## 2020-10-10 DIAGNOSIS — R350 Frequency of micturition: Secondary | ICD-10-CM | POA: Diagnosis not present

## 2020-10-11 DIAGNOSIS — H5711 Ocular pain, right eye: Secondary | ICD-10-CM | POA: Diagnosis not present

## 2020-10-11 DIAGNOSIS — Z973 Presence of spectacles and contact lenses: Secondary | ICD-10-CM | POA: Diagnosis not present

## 2021-02-15 IMAGING — CT CT ANGIO CHEST
2 of 3 series · 19 of 32 positions shown · IV contrast (APPLIED)
Comparison: 03/30/2020

CLINICAL DATA: History of C3XT2-M2 infection

EXAM:
CT ANGIOGRAPHY CHEST WITH CONTRAST
TECHNIQUE: Multidetector CT imaging of the chest was performed using the
standard protocol during bolus administration of intravenous
contrast. Multiplanar CT image reconstructions and MIPs were
obtained to evaluate the vascular anatomy.
CONTRAST:  75mL [id number] IOPAMIDOL ([id number]) INJECTION 76%

[Series 10: thins 1.0 b31s · axial · 0.76mm/px · z∈[-316,-54]mm · 16 of 304 slices shown]
[im 21/304  lung]
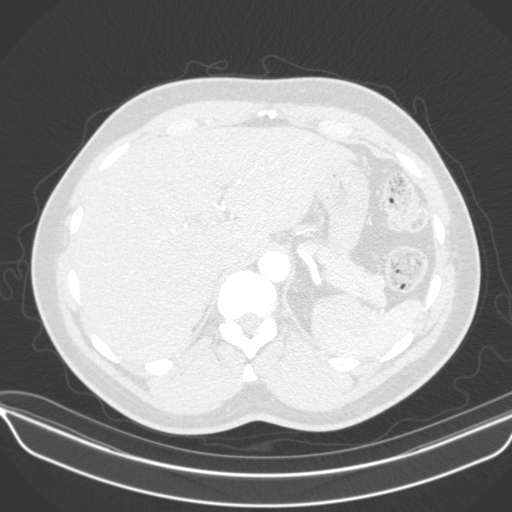
[im 41/304  mediastinal]
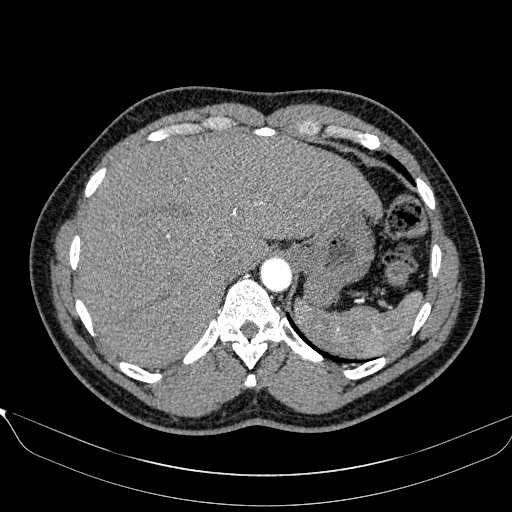
[im 61/304  lung]
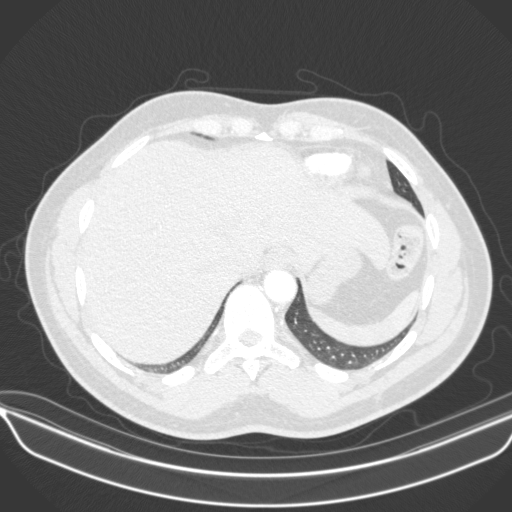
[im 81/304  mediastinal]
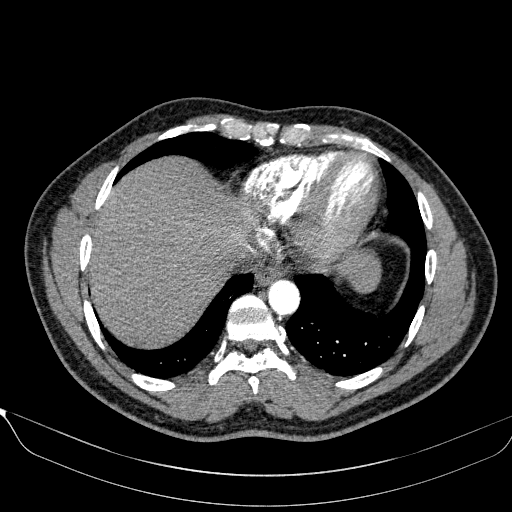
[im 102/304  lung]
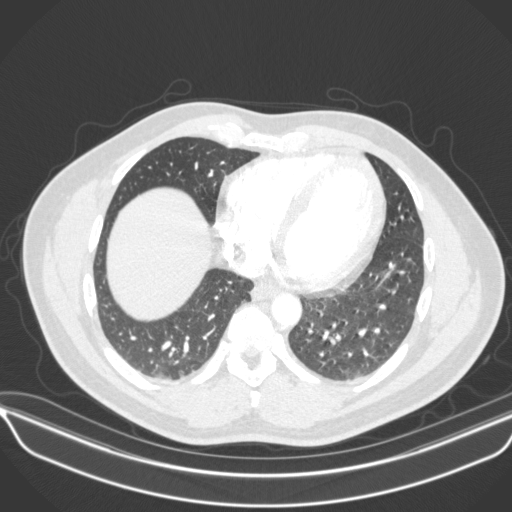
[im 122/304  mediastinal]
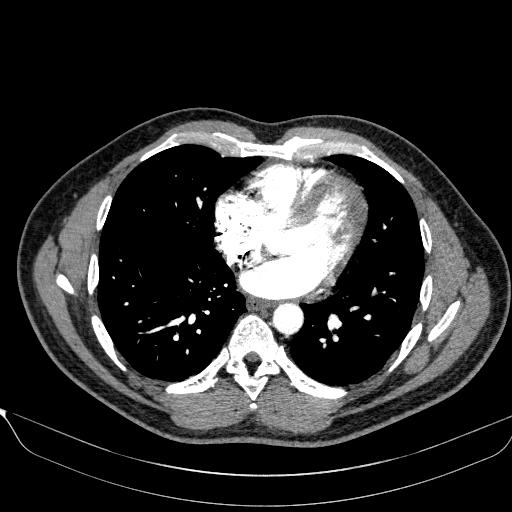
[im 142/304  lung]
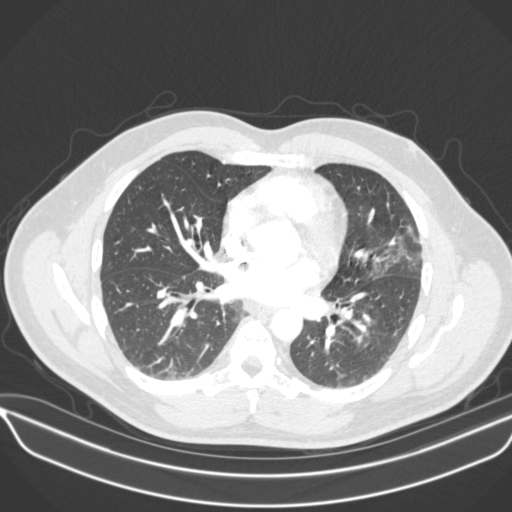
[im 143/304  mediastinal]
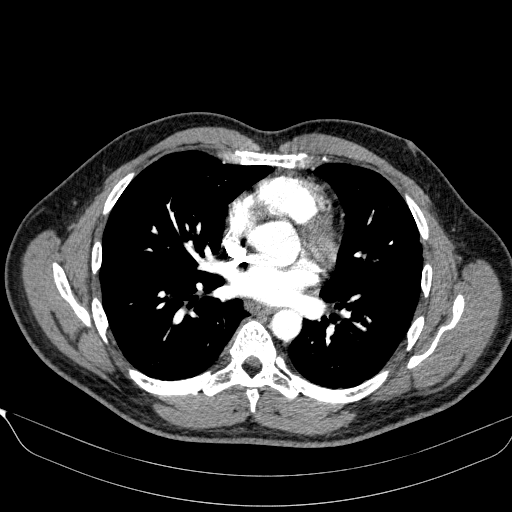
[im 152/304  lung]
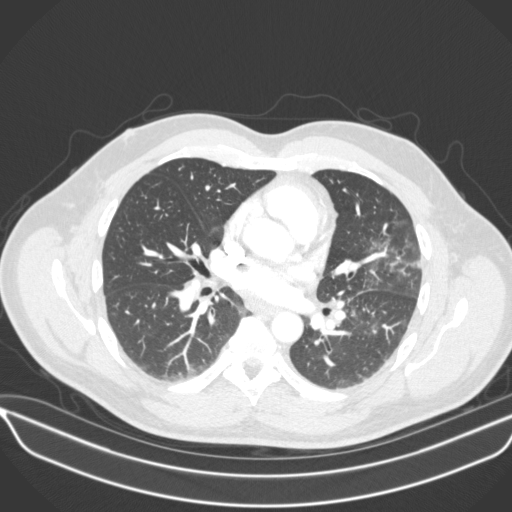
[im 162/304  mediastinal]
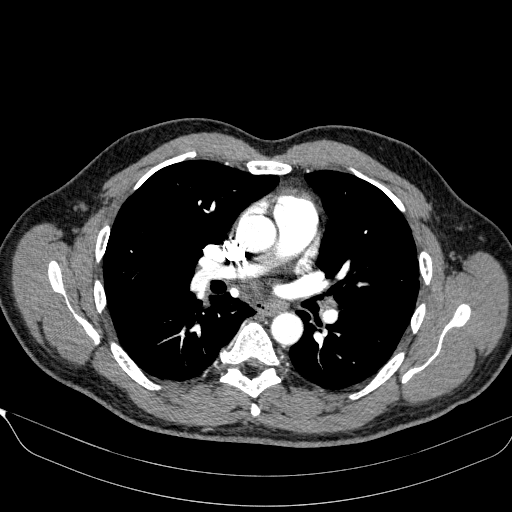
[im 182/304  lung]
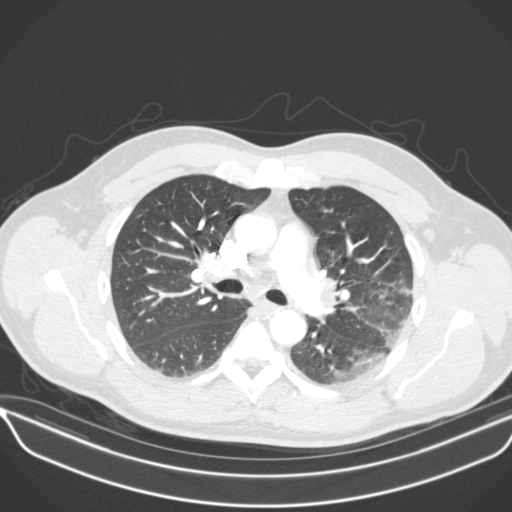
[im 203/304  mediastinal]
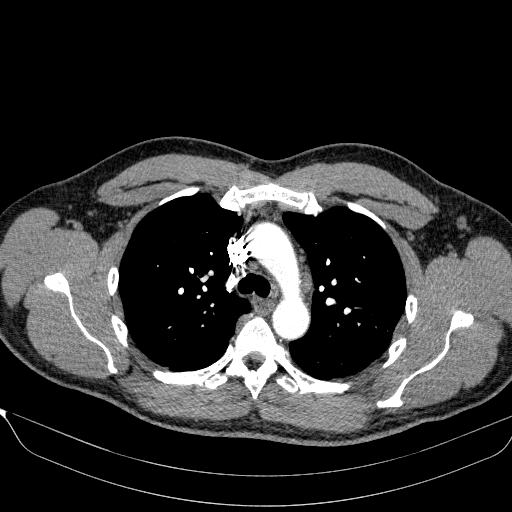
[im 223/304  lung]
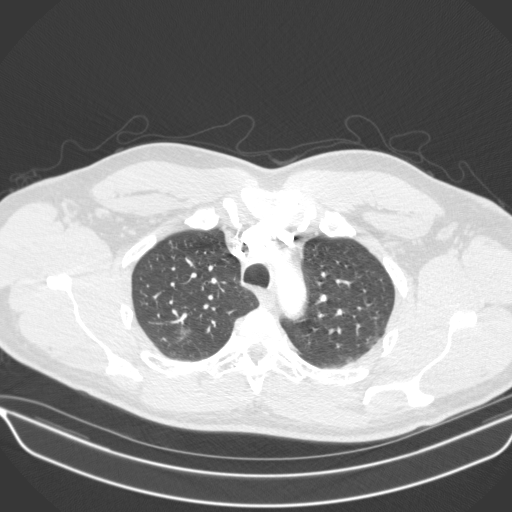
[im 243/304  mediastinal]
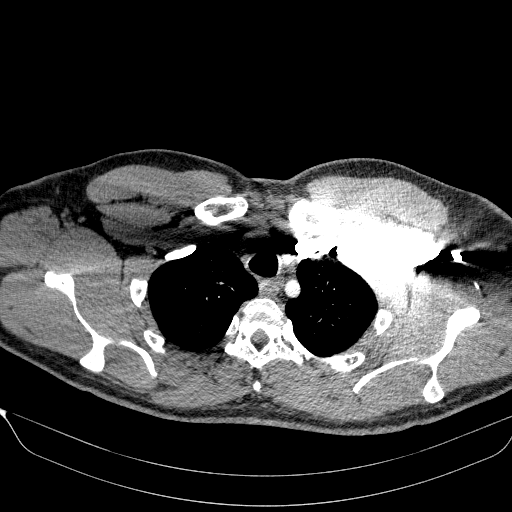
[im 263/304  lung]
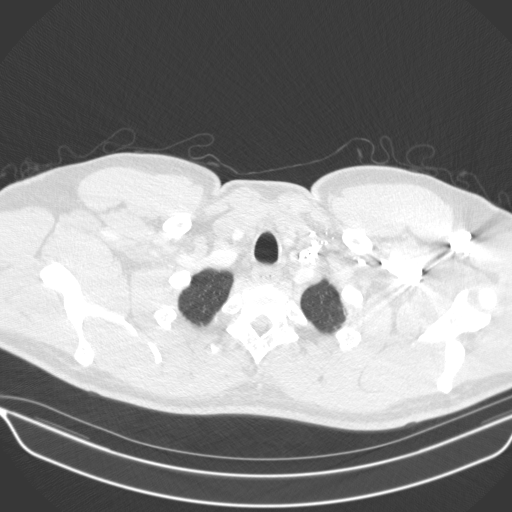
[im 283/304  mediastinal]
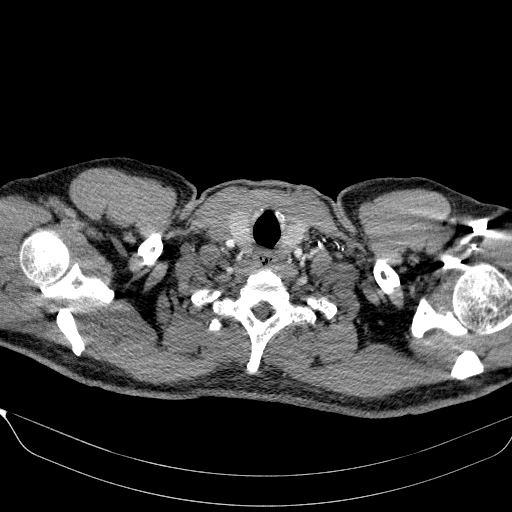

[Series 12: lung · axial · 0.76mm/px · z∈[-253,-193]mm · 3 of 132 slices shown]
[im 22/132  mediastinal]
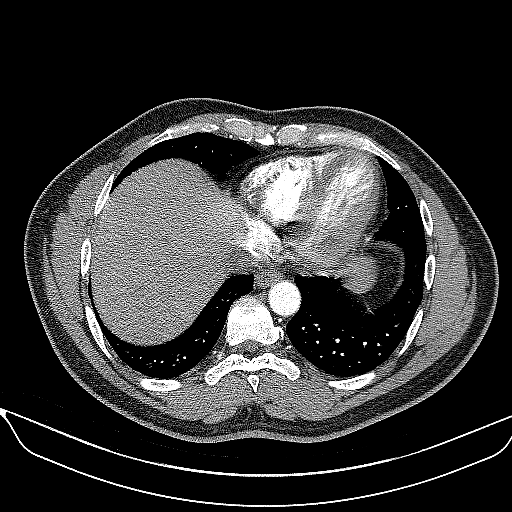
[im 44/132  mediastinal]
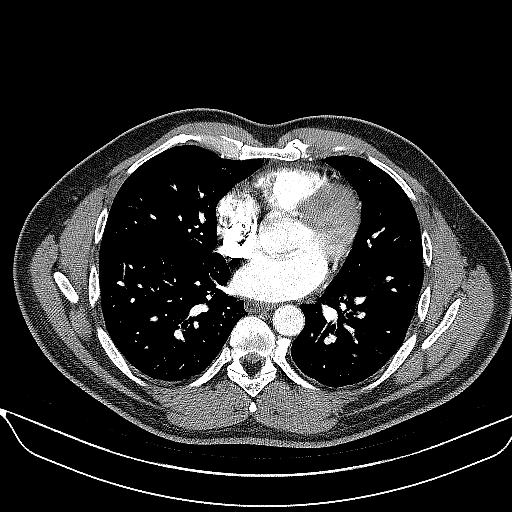
[im 52/132  mediastinal]
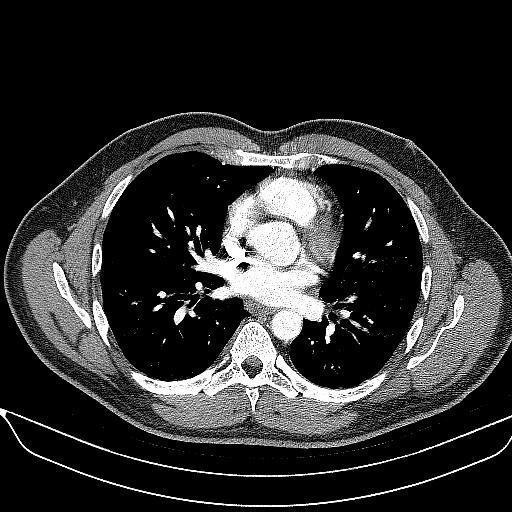

[19 of 32 positions shown; findings below may reference images not displayed]

FINDINGS: Cardiovascular: Heart size is normal. No pericardial effusion.
Aortic caliber is normal without atherosclerotic changes.

Study quality is adequate though mildly limited by bolus timing. No
evidence of pulmonary embolism.

Mediastinum/Nodes: Esophagus grossly normal.

No hilar adenopathy. Pre-vascular node measuring 11 mm. No thoracic
inlet adenopathy. No axillary lymphadenopathy.

Lungs/Pleura: Patchy areas of airspace disease at the periphery in
the LEFT upper and superior segment of the LEFT lower lobe
ground-glass predominant features mild septal thickening. No dense
consolidation. The airways are patent.

Upper Abdomen: Mildly lobular hepatic contours. No suggestion of
splenic enlargement. Tiny hypodense lesion in the RIGHT hepatic lobe
is likely a cyst. No acute upper abdominal process with very limited
assessment of upper abdominal contents.

Musculoskeletal: No acute musculoskeletal process. No destructive
bone finding.

Review of the MIP images confirms the above findings.
IMPRESSION: 1. No evidence of pulmonary embolism.
2. Patchy areas of airspace disease at the periphery in the LEFT
upper and superior segment of the LEFT lower lobe with ground-glass
predominant features and mild septal thickening. Pulmonary findings
of C3XT2-M2 infection, associated with reactive lymph nodes in the
mediastinum.
3. Mildly lobular hepatic contours, nonspecific. Correlate with any
clinical or laboratory evidence of liver disease.

Aortic Atherosclerosis ([id number].F).

## 2022-02-06 ENCOUNTER — Ambulatory Visit (INDEPENDENT_AMBULATORY_CARE_PROVIDER_SITE_OTHER): Payer: BC Managed Care – PPO | Admitting: Podiatry

## 2022-02-06 DIAGNOSIS — L989 Disorder of the skin and subcutaneous tissue, unspecified: Secondary | ICD-10-CM

## 2022-02-06 NOTE — Progress Notes (Signed)
   Chief Complaint  Patient presents with   Callouses    Patient states that he is here for callous trim.    Subjective:  Patient presents today for follow-up evaluation of symptomatic calluses to the bilateral forefoot.  Patient last seen 2021.  Patient states that over the last few years she has been debriding his calluses himself however they are becoming more symptomatic and unmanageable.  He presents for routine conservative care.  No past medical history on file. Past Surgical History:  Procedure Laterality Date   FINGER AMPUTATION     left hand index finger 2000   WISDOM TOOTH EXTRACTION     No Known Allergies   Objective: Physical Exam General: The patient is alert and oriented x3 in no acute distress.  Dermatology: Skin is cool, dry and supple bilateral lower extremities. Negative for open lesions or macerations.  Painful callus lesions are noted to the plantar bilateral forefoot with a central nucleated core consistent with a deep porokeratosis bilateral.  Skin incisions to the dorsum of the foot have healed completely. Hyperkeratotic dystrophic discolored nails noted bilateral  Vascular: Palpable pedal pulses bilaterally. No edema or erythema noted. Capillary refill within normal limits.  Neurological: Epicritic and protective threshold grossly intact bilaterally.   Musculoskeletal Exam: All pedal and ankle joints range of motion within normal limits bilateral. Muscle strength 5/5 in all groups bilateral.  Significant pain on palpation to the bilateral porokeratotic lesions noted  Assessment: 1. s/p Weil osteotomy bilateral. DOS: 07/08/2019 2. Porokeratosis bilateral feet, left worse than right  3. Onychomcyosis of toenails bilatearl  Plan of Care:  1. Patient was evaluated.   2. Excisional debridement of keratotic lesion(s) using a chisel blade was performed without incident.  3.  Continue wearing good supportive shoes and sneakers to offload pressure from the  forefoot.  We will continue conservative care for now and management 4.  Return to clinic as needed  Felecia Shelling, DPM [facility]  Dr. Felecia Shelling, DPM    [address]                                        [place], [place] [address]                Office [phone]  Fax [phone]

## 2022-02-26 ENCOUNTER — Telehealth: Payer: Self-pay | Admitting: *Deleted

## 2022-02-26 ENCOUNTER — Other Ambulatory Visit: Payer: Self-pay | Admitting: Podiatry

## 2022-02-26 MED ORDER — DICLOFENAC SODIUM 75 MG PO TBEC: DELAYED_RELEASE_TABLET | ORAL | 1 refills | Status: DC

## 2022-02-26 NOTE — Telephone Encounter (Signed)
Patient is calling to request something for pain,woke up w/ pain, has been aching all day. Can something be prescribed? Please advise.

## 2022-02-27 NOTE — Telephone Encounter (Signed)
Patient notified

## 2022-07-20 ENCOUNTER — Ambulatory Visit (INDEPENDENT_AMBULATORY_CARE_PROVIDER_SITE_OTHER): Payer: BC Managed Care – PPO

## 2022-07-20 ENCOUNTER — Ambulatory Visit
Admission: RE | Admit: 2022-07-20 | Discharge: 2022-07-20 | Disposition: A | Discharge status: Home / Self Care | Payer: BC Managed Care – PPO | Source: Ambulatory Visit | Attending: Urgent Care | Admitting: Urgent Care

## 2022-07-20 VITALS — BP 122/76 | HR 87 | Temp 98.2°F | Resp 18

## 2022-07-20 DIAGNOSIS — M25511 Pain in right shoulder: Secondary | ICD-10-CM

## 2022-07-20 MED ORDER — PREDNISONE 20 MG PO TABS: ORAL_TABLET | ORAL | 0 refills | Status: DC

## 2022-07-20 NOTE — ED Provider Notes (Signed)
[facility]  Note:  This document was prepared using Systems analyst and may include unintentional dictation errors.  MRN: [id number] DOB: December 09, 1975  Subjective:   Jared Reynolds is a 47 y.o. male presenting for 2 to 46-month history of persistent right shoulder pain.  No trauma, fall.  Patient does a lot of fast-paced heavy lifting for his work.  Has not taking medications for relief.  States that he does not like to take medications.  No history of diabetes.  No current facility-administered medications for this encounter.  Current Outpatient Medications:    albuterol (VENTOLIN HFA) 108 (90 Base) MCG/ACT inhaler, Inhale 1-2 puffs into the lungs every 6 (six) hours as needed for wheezing or shortness of breath., Disp: 18 g, Rfl: 0   benzonatate (TESSALON) 100 MG capsule, Take 1-2 capsules (100-200 mg total) by mouth 3 (three) times daily as needed., Disp: 60 capsule, Rfl: 0   diclofenac (VOLTAREN) 75 MG EC tablet, TAKE 1 TABLET(75 MG) BY MOUTH TWICE DAILY, Disp: 60 tablet, Rfl: 1   oxyCODONE-acetaminophen (PERCOCET) 5-325 MG tablet, Take 1 tablet by mouth every 6 (six) hours as needed for severe pain., Disp: 30 tablet, Rfl: 0   sildenafil (VIAGRA) 100 MG tablet, Take 100 mg by mouth daily., Disp: , Rfl:    terbinafine (LAMISIL) 250 MG tablet, Take 1 tablet (250 mg total) by mouth daily., Disp: 90 tablet, Rfl: 0   No Known Allergies  History reviewed. No pertinent past medical history.   Past Surgical History:  Procedure Laterality Date   FINGER AMPUTATION     left hand index finger 2000   FINGER AMPUTATION     FOOT SURGERY     WISDOM TOOTH EXTRACTION      Family History  Problem Relation Age of Onset   Hypertension Mother     Social History   Tobacco Use   Smoking status: Every Day    Types: Cigars   Smokeless tobacco: Never  Vaping Use   Vaping Use: Never used  Substance Use Topics   Alcohol use: Yes    Comment: daily    Drug use: Yes    ROS   Objective:   Vitals: BP 122/76 (BP Location: Right Arm)   Pulse 87   Temp 98.2 F (36.8 C) (Oral)   Resp 18   SpO2 99%   Physical Exam Constitutional:      General: He is not in acute distress.    Appearance: Normal appearance. He is well-developed and normal weight. He is not ill-appearing, toxic-appearing or diaphoretic.  HENT:     Head: Normocephalic and atraumatic.     Right Ear: External ear normal.     Left Ear: External ear normal.     Nose: Nose normal.     Mouth/Throat:     Pharynx: Oropharynx is clear.  Eyes:     General: No scleral icterus.       Right eye: No discharge.        Left eye: No discharge.     Extraocular Movements: Extraocular movements intact.  Cardiovascular:     Rate and Rhythm: Normal rate.  Pulmonary:     Effort: Pulmonary effort is normal.  Musculoskeletal:     Cervical back: Normal range of motion.     Comments: Right shoulder: Near full range of motion for the right shoulder.  Tenderness across the deltoids, biceps tendon.  Positive Hawkins and Neer test.  Strength 5/5 for upper extremities.  No ecchymosis, swelling.  Neurological:     Mental Status: He is alert and oriented to person, place, and time.  Psychiatric:        Mood and Affect: Mood normal.        Behavior: Behavior normal.        Thought Content: Thought content normal.        Judgment: Judgment normal.     Assessment and Plan :   PDMP not reviewed this encounter.  1. Acute pain of right shoulder     Patient requested to most immediate relief as he does not like to take pills.  We discussed injectable medicine versus oral.  Ultimately, we decided to use prednisone as it passes acting, stronger anti-inflammatory.  Follow-up with Ortho.  X-ray over-read was pending at time of discharge, recommended follow up with only abnormal results. Otherwise will not call for negative over-read. Patient was in agreement.   Counseled patient on potential  for adverse effects with medications prescribed/recommended today, ER and return-to-clinic precautions discussed, patient verbalized understanding.    Jaynee Eagles, [place] 07/20/22 [phone]
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
## 2022-12-02 ENCOUNTER — Emergency Department (HOSPITAL_COMMUNITY): Payer: BC Managed Care – PPO

## 2022-12-02 ENCOUNTER — Emergency Department (HOSPITAL_COMMUNITY)
Admission: EM | Admit: 2022-12-02 | Discharge: 2022-12-02 | Disposition: A | Discharge status: Home / Self Care | Payer: BC Managed Care – PPO | Attending: Emergency Medicine | Admitting: Emergency Medicine

## 2022-12-02 ENCOUNTER — Other Ambulatory Visit: Payer: Self-pay

## 2022-12-02 ENCOUNTER — Encounter (HOSPITAL_COMMUNITY): Payer: Self-pay

## 2022-12-02 ENCOUNTER — Ambulatory Visit (HOSPITAL_COMMUNITY)
Admission: EM | Admit: 2022-12-02 | Discharge: 2022-12-02 | Disposition: A | Discharge status: Home / Self Care | Payer: BC Managed Care – PPO | Attending: Family Medicine | Admitting: Family Medicine

## 2022-12-02 ENCOUNTER — Encounter (HOSPITAL_COMMUNITY): Payer: Self-pay | Admitting: *Deleted

## 2022-12-02 DIAGNOSIS — R0789 Other chest pain: Secondary | ICD-10-CM | POA: Diagnosis present

## 2022-12-02 DIAGNOSIS — Z8249 Family history of ischemic heart disease and other diseases of the circulatory system: Secondary | ICD-10-CM

## 2022-12-02 DIAGNOSIS — E782 Mixed hyperlipidemia: Secondary | ICD-10-CM

## 2022-12-02 DIAGNOSIS — R9431 Abnormal electrocardiogram [ECG] [EKG]: Secondary | ICD-10-CM | POA: Diagnosis not present

## 2022-12-02 DIAGNOSIS — I200 Unstable angina: Secondary | ICD-10-CM | POA: Diagnosis not present

## 2022-12-02 DIAGNOSIS — R079 Chest pain, unspecified: Secondary | ICD-10-CM

## 2022-12-02 LAB — CBC
HCT: 41.4 % (ref 39.0–52.0)
Hemoglobin: 14.1 g/dL (ref 13.0–17.0)
MCH: 31 pg (ref 26.0–34.0)
MCHC: 34.1 g/dL (ref 30.0–36.0)
MCV: 91 fL (ref 80.0–100.0)
Platelets: 255 10*3/uL (ref 150–400)
RBC: 4.55 MIL/uL (ref 4.22–5.81)
RDW: 13.7 % (ref 11.5–15.5)
WBC: 6 10*3/uL (ref 4.0–10.5)
nRBC: 0 % (ref 0.0–0.2)

## 2022-12-02 LAB — BASIC METABOLIC PANEL
Anion gap: 14 (ref 5–15)
BUN: 10 mg/dL (ref 6–20)
CO2: 24 mmol/L (ref 22–32)
Calcium: 9.7 mg/dL (ref 8.9–10.3)
Chloride: 101 mmol/L (ref 98–111)
Creatinine, Ser: 0.82 mg/dL (ref 0.61–1.24)
GFR, Estimated: >60 mL/min (ref >60)
Glucose, Bld: 98 mg/dL (ref 70–99)
Potassium: 4.1 mmol/L (ref 3.5–5.1)
Sodium: 139 mmol/L (ref 135–145)

## 2022-12-02 LAB — TROPONIN I (HIGH SENSITIVITY)
Troponin I (High Sensitivity): 5 ng/L (ref <18)
Troponin I (High Sensitivity): 5 ng/L (ref <18)

## 2022-12-02 LAB — D-DIMER, QUANTITATIVE: D-Dimer, Quant: <0.27 ug/mL-FEU (ref 0.00–0.50)

## 2022-12-02 NOTE — ED Triage Notes (Signed)
Pt reports on Friday while at work he had Lt sided chest pressure and pain. Pt went home from work early and rested over weekend. Pt reports he still has chest pressure on lt that comes and goes.

## 2022-12-02 NOTE — ED Provider Notes (Signed)
[facility] AT [facility] Provider Note   CSN: [id number] Arrival date & time: 12/02/22  1133     History  Chief Complaint  Patient presents with   Abnromal EKG   Chest Pain    Jared Reynolds is a 47 y.o. male.  Patient presented to the emergency department complaining of chest pressure that began on Friday.  Patient states pain has been intermittently mild and dull in nature and has increased in severity over the weekend.  He states that there are no aggravating or alleviating factors.  This morning the patient went to urgent care after his pain increased to 7 out of 10 in severity.  The urgent care provider was concerned over possible Q waves in the EKG and recommended to come to the emergency department for further evaluation.  The patient denies shortness of breath but endorses left-sided chest pain.  He currently denies any radiation of symptoms.  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  The patient does not smoke cigarettes but does endorse smoking occasional cigars.  Patient denies other comorbidities including diabetes, hypertension, high cholesterol.  HPI     Home Medications Prior to Admission medications   Medication Sig Start Date End Date Taking? Authorizing Provider  triamcinolone (NASACORT) 55 MCG/ACT nasal inhaler Place 2 sprays into the nose daily. 10/07/11 07/01/19  Elpidio Anis, PA-C      Allergies    Patient has no known allergies.    Review of Systems   Review of Systems  Physical Exam Updated Vital Signs BP (!) 133/91   Pulse 70   Temp 97.9 F (36.6 C)   Resp 18   SpO2 100%  Physical Exam Vitals and nursing note reviewed.  Constitutional:      General: He is not in acute distress.    Appearance: He is well-developed.  HENT:     Head: Normocephalic and atraumatic.  Eyes:     Conjunctiva/sclera: Conjunctivae normal.  Cardiovascular:      Rate and Rhythm: Normal rate and regular rhythm.     Heart sounds: No murmur heard. Pulmonary:     Effort: Pulmonary effort is normal. No respiratory distress.     Breath sounds: Normal breath sounds.  Abdominal:     Palpations: Abdomen is soft.     Tenderness: There is no abdominal tenderness.  Musculoskeletal:        General: No swelling.     Cervical back: Neck supple.  Skin:    General: Skin is warm and dry.     Capillary Refill: Capillary refill takes less than 2 seconds.  Neurological:     Mental Status: He is alert.  Psychiatric:        Mood and Affect: Mood normal.     ED Results / Procedures / Treatments   Labs (all labs ordered are listed, but only abnormal results are displayed) Labs Reviewed  BASIC METABOLIC PANEL  CBC  D-DIMER, QUANTITATIVE  TROPONIN I (HIGH SENSITIVITY)  TROPONIN I (HIGH SENSITIVITY)    EKG None  Radiology DG Chest 2 View  Result Date: 12/02/2022 CLINICAL DATA:  Chest pain EXAM: CHEST - 2 VIEW COMPARISON:  03/21/2020 x-ray FINDINGS: No consolidation, pneumothorax or effusion. No edema. Normal cardiopericardial silhouette. Mild peribronchial thickening on the left. IMPRESSION: Mild left-sided peribronchial thickening. Electronically Signed   By: Karen Kays M.D.   On: 12/02/2022 13:13  Procedures Procedures    Medications Ordered in ED Medications - No data to display  ED Course/ Medical Decision Making/ A&P                             Medical Decision Making Amount and/or Complexity of Data Reviewed Labs: ordered. Radiology: ordered.   This patient presents to the ED for concern of chest pain, this involves an extensive number of treatment options, and is a complaint that carries with it a high risk of complications and morbidity.  The differential diagnosis includes ACS, PE, pneumonia, musculoskeletal pain, others   Co morbidities that complicate the patient evaluation  Cigar usage   Additional history  obtained:  Additional history obtained from visitor at bedside  External records from outside source obtained and reviewed including urgent care notes   Lab Tests:  I Ordered, and personally interpreted labs.  The pertinent results include: Negative troponins x 2, negative D-dimer, unremarkable BMP and CBC   Imaging Studies ordered:  I ordered imaging studies including chest x-ray I independently visualized and interpreted imaging which showed mild left-sided peribronchial thickening I agree with the radiologist interpretation   Cardiac Monitoring: / EKG:  The patient was maintained on a cardiac monitor.  I personally viewed and interpreted the cardiac monitored which showed an underlying rhythm of: Normal sinus rhythm   Test / Admission - Considered:  The patient has negative troponins x 2, nonischemic EKG, low heart score.  Very low clinical suspicion of ACS.  Negative D-dimer showing no signs of clot burden.  No pneumonia on chest x-ray.  Question of this could be musculoskeletal based on patient's career, but unclear etiology at this time.  Feel comfortable discharging patient home with return precautions with plans for outpatient cardiology follow-up.  Patient voices understanding and agreement with plan.  Discharge home at this time.         Final Clinical Impression(s) / ED Diagnoses Final diagnoses:  Chest pain, unspecified type    Rx / DC Orders ED Discharge Orders          Ordered    Ambulatory referral to Cardiology       Comments: If you have not heard from the Cardiology office within the next 72 hours please call [phone].   12/02/22 1553              Darrick Grinder, PA-C 12/02/22 1627    Ernie Avena, MD 12/02/22 2029

## 2022-12-02 NOTE — ED Triage Notes (Signed)
Pt came in via POV d/t Chest pressure the started 3 days ago & radiates into his Lt arm feeling like a tingling. A/Ox4, rates pain 7/10, he went to UC today & they reported after seeing his EKG to come in for eval.

## 2022-12-02 NOTE — ED Provider Triage Note (Signed)
Emergency Medicine Provider Triage Evaluation Note  Facundo Lantis , a 47 y.o. male  was evaluated in triage.  Pt complains of chest pain. Report having persistent L side chest pressure and feeling fatigue x 3 days.  No fever, chills, cough, sob, nausea, or diaphoresis.  Still having some chest pressure.  Hx of tobacco and alcohol use.  No hx of DM, or HTN.  Seen at [facility] and sent here for further assessment  Review of Systems  Positive: As above Negative: As above  Physical Exam  BP (!) 133/91   Pulse 70   Temp 97.9 F (36.6 C)   Resp 18   SpO2 100%  Gen:   Awake, no distress   Resp:  Normal effort  MSK:   Moves extremities without difficulty  Other:    Medical Decision Making  Medically screening exam initiated at 12:12 PM.  Appropriate orders placed.  Olliver Vastine was informed that the remainder of the evaluation will be completed by another provider, this initial triage assessment does not replace that evaluation, and the importance of remaining in the ED until their evaluation is complete.     Fayrene Helper, PA-C 12/02/22 1218

## 2022-12-02 NOTE — Discharge Instructions (Addendum)
You were evaluated today for chest pain.  Your workup was reassuring.  I have placed a referral to cardiology for further evaluation and management.  If you do not hear from their office within 3 days please contact them to schedule an appointment.  If you develop any further life-threatening symptoms please return to the emergency department for reevaluation

## 2022-12-02 NOTE — ED Provider Notes (Addendum)
Patient complains of left-sided chest pain and pressure that has been occurring intermittently for the past 3 days.  Patient states episodes last a few minutes and then dissipate on their own.  Patient denies shortness of breath and diaphoresis however states the pain is acute enough that it gets his attention.  Patient states he has never had similar chest pain in the past.  Patient states he has not taken anything to alleviate his chest pain.  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  EMR reviewed, patient has elevated cholesterol levels but historically normal blood pressure.  Patient reports family history of heart disease in his mother and maternal grandmother.    Vital signs reviewed and were normal.  Patient alert and oriented x 3, mentating well, nondiaphoretic.    EKG performed in triage demonstrated Q waves in anterior leads concerning for anterior infarct.  Patient was advised to go to the emergency room for further evaluation of possible MI.  Patient deemed stable for private transport to the emergency room.  Patient agreed to go now.   Theadora Rama Scales, PA-C 12/02/22 1131    Theadora Rama Cozad, [place] 12/02/22 1134

## 2022-12-23 ENCOUNTER — Ambulatory Visit: Payer: BC Managed Care – PPO | Attending: Cardiovascular Disease | Admitting: Cardiovascular Disease

## 2022-12-23 ENCOUNTER — Encounter: Payer: Self-pay | Admitting: Cardiovascular Disease
